# Patient Record
Sex: Female | Born: 1970 | Race: White | Hispanic: No | Marital: Single | Smoking: Never smoker
Health system: Southern US, Community
[De-identification: ages and names within clinical notes are randomized; demographics above are authoritative.]

## PROBLEM LIST (undated history)

## (undated) DIAGNOSIS — M859 Disorder of bone density and structure, unspecified: Secondary | ICD-10-CM

## (undated) DIAGNOSIS — F32A Depression, unspecified: Secondary | ICD-10-CM

## (undated) DIAGNOSIS — S72009A Fracture of unspecified part of neck of unspecified femur, initial encounter for closed fracture: Secondary | ICD-10-CM

## (undated) DIAGNOSIS — F71 Moderate intellectual disabilities: Secondary | ICD-10-CM

## (undated) DIAGNOSIS — R2681 Unsteadiness on feet: Secondary | ICD-10-CM

## (undated) DIAGNOSIS — F209 Schizophrenia, unspecified: Secondary | ICD-10-CM

## (undated) DIAGNOSIS — F79 Unspecified intellectual disabilities: Secondary | ICD-10-CM

## (undated) DIAGNOSIS — F329 Major depressive disorder, single episode, unspecified: Secondary | ICD-10-CM

## (undated) DIAGNOSIS — G800 Spastic quadriplegic cerebral palsy: Secondary | ICD-10-CM

## (undated) DIAGNOSIS — G809 Cerebral palsy, unspecified: Secondary | ICD-10-CM

## (undated) DIAGNOSIS — F200 Paranoid schizophrenia: Secondary | ICD-10-CM

## (undated) DIAGNOSIS — R569 Unspecified convulsions: Secondary | ICD-10-CM

## (undated) DIAGNOSIS — S72001A Fracture of unspecified part of neck of right femur, initial encounter for closed fracture: Secondary | ICD-10-CM

## (undated) DIAGNOSIS — M810 Age-related osteoporosis without current pathological fracture: Secondary | ICD-10-CM

## (undated) DIAGNOSIS — R5600 Simple febrile convulsions: Secondary | ICD-10-CM

## (undated) DIAGNOSIS — F419 Anxiety disorder, unspecified: Secondary | ICD-10-CM

## (undated) HISTORY — DX: Simple febrile convulsions: R56.00

## (undated) HISTORY — DX: Paranoid schizophrenia: F20.0

## (undated) HISTORY — DX: Moderate intellectual disabilities: F71

## (undated) HISTORY — DX: Fracture of unspecified part of neck of right femur, initial encounter for closed fracture: S72.001A

## (undated) HISTORY — DX: Disorder of bone density and structure, unspecified: M85.9

## (undated) HISTORY — DX: Unsteadiness on feet: R26.81

## (undated) HISTORY — DX: Spastic quadriplegic cerebral palsy: G80.0

## (undated) HISTORY — DX: Age-related osteoporosis without current pathological fracture: M81.0

---
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
## 2017-09-25 DIAGNOSIS — M9701XA Periprosthetic fracture around internal prosthetic right hip joint, initial encounter: Secondary | ICD-10-CM | POA: Insufficient documentation

## 2017-11-12 DIAGNOSIS — Z96649 Presence of unspecified artificial hip joint: Secondary | ICD-10-CM | POA: Insufficient documentation

## 2018-01-12 DIAGNOSIS — S72009A Fracture of unspecified part of neck of unspecified femur, initial encounter for closed fracture: Secondary | ICD-10-CM | POA: Insufficient documentation

## 2018-01-12 DIAGNOSIS — G809 Cerebral palsy, unspecified: Secondary | ICD-10-CM | POA: Insufficient documentation

## 2018-01-12 DIAGNOSIS — R569 Unspecified convulsions: Secondary | ICD-10-CM

## 2018-01-12 DIAGNOSIS — I10 Essential (primary) hypertension: Secondary | ICD-10-CM | POA: Insufficient documentation

## 2018-01-12 DIAGNOSIS — R6250 Unspecified lack of expected normal physiological development in childhood: Secondary | ICD-10-CM | POA: Insufficient documentation

## 2018-01-12 DIAGNOSIS — F209 Schizophrenia, unspecified: Secondary | ICD-10-CM | POA: Insufficient documentation

## 2018-01-12 DIAGNOSIS — F419 Anxiety disorder, unspecified: Secondary | ICD-10-CM | POA: Insufficient documentation

## 2018-01-12 DIAGNOSIS — K219 Gastro-esophageal reflux disease without esophagitis: Secondary | ICD-10-CM | POA: Insufficient documentation

## 2018-01-12 DIAGNOSIS — Z01419 Encounter for gynecological examination (general) (routine) without abnormal findings: Secondary | ICD-10-CM | POA: Insufficient documentation

## 2018-01-12 DIAGNOSIS — G40909 Epilepsy, unspecified, not intractable, without status epilepticus: Secondary | ICD-10-CM | POA: Insufficient documentation

## 2018-01-12 DIAGNOSIS — M8589 Other specified disorders of bone density and structure, multiple sites: Secondary | ICD-10-CM | POA: Insufficient documentation

## 2018-05-23 DIAGNOSIS — M79604 Pain in right leg: Secondary | ICD-10-CM | POA: Insufficient documentation

## 2018-05-24 DIAGNOSIS — R7989 Other specified abnormal findings of blood chemistry: Secondary | ICD-10-CM | POA: Insufficient documentation

## 2018-07-19 ENCOUNTER — Other Ambulatory Visit: Payer: Self-pay | Admitting: Family Medicine

## 2018-07-19 DIAGNOSIS — Z1231 Encounter for screening mammogram for malignant neoplasm of breast: Secondary | ICD-10-CM

## 2018-07-19 DIAGNOSIS — E2839 Other primary ovarian failure: Secondary | ICD-10-CM

## 2018-07-21 ENCOUNTER — Other Ambulatory Visit: Payer: Self-pay | Admitting: Family Medicine

## 2018-07-21 DIAGNOSIS — Z13820 Encounter for screening for osteoporosis: Secondary | ICD-10-CM

## 2018-07-21 DIAGNOSIS — Z87311 Personal history of (healed) other pathological fracture: Secondary | ICD-10-CM

## 2018-08-09 ENCOUNTER — Encounter: Payer: Self-pay | Admitting: Sports Medicine

## 2018-08-09 ENCOUNTER — Ambulatory Visit (INDEPENDENT_AMBULATORY_CARE_PROVIDER_SITE_OTHER): Payer: Medicare Other | Admitting: Sports Medicine

## 2018-08-09 ENCOUNTER — Other Ambulatory Visit: Payer: Self-pay

## 2018-08-09 VITALS — BP 98/66 | HR 77 | Resp 16

## 2018-08-09 DIAGNOSIS — B351 Tinea unguium: Secondary | ICD-10-CM

## 2018-08-09 DIAGNOSIS — M79675 Pain in left toe(s): Secondary | ICD-10-CM

## 2018-08-09 DIAGNOSIS — R6250 Unspecified lack of expected normal physiological development in childhood: Secondary | ICD-10-CM

## 2018-08-09 DIAGNOSIS — M79674 Pain in right toe(s): Secondary | ICD-10-CM | POA: Diagnosis not present

## 2018-08-09 DIAGNOSIS — G808 Other cerebral palsy: Secondary | ICD-10-CM

## 2018-08-09 NOTE — Progress Notes (Signed)
  Subjective: Katherine Wolf is a 47 y.o. female patient seen today in office with complaint of mildly painful thickened and elongated toenails; unable to trim. Patient is assisted by facility caregiver who reports that patient is here for a nail trim and states that her guardian is concerned about the right great toenail which appears to be taking down in the skin.  Patient and facility caregiver denies history of Diabetes, Neuropathy, or Vascular disease. Patient has no other pedal complaints at this time.   Review of Systems  All other systems reviewed and are negative.    Patient Active Problem List   Diagnosis Date Noted  . Elevated d-dimer 05/24/2018  . Pain of right lower extremity 05/23/2018  . Anxiety 01/12/2018  . Cerebral palsy (HCC) 01/12/2018  . 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 GERD (gastroesophageal reflux disease) 01/12/2018  . Hip fracture (HCC) 01/12/2018  . Hypertension 01/12/2018  . Osteopenia of multiple sites 01/12/2018  . Schizophrenia (HCC) 01/12/2018  . Seizures (HCC) 01/12/2018  . Well woman exam 01/12/2018  . 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 Periprosthetic fracture around internal prosthetic right hip joint (HCC) 09/25/2017    Current Outpatient Medications on File Prior to Visit  Medication Sig Dispense Refill  . Cholecalciferol (VITAMIN D3) 1000 units CAPS Take 1 capsule by mouth daily.    Marland Kitchen docusate sodium (COLACE) 50 MG capsule Take 50 mg by mouth 2 (two) times daily.     No current facility-administered medications on file prior to visit.     Allergies  Allergen Reactions  . Penicillins     Objective: Physical Exam  General: Well developed, nourished, no acute distress, awake, alert and oriented x 2  Vascular: Dorsalis pedis artery 1/4 bilateral, Posterior tibial artery 1/4 bilateral, skin temperature warm to warm proximal to distal bilateral lower extremities, no varicosities, pedal hair present  bilateral.  Neurological: Gross sensation present via light touch bilateral.   Dermatological: Skin is warm, dry, and supple bilateral, Nails 1-10 are tender, long, thick, and discolored with mild subungal debris, no webspace macerations present bilateral with distal lifting of the right hallux nail and severe incurvation with subungual debris, no open lesions present bilateral, no callus/corns/hyperkeratotic tissue present bilateral. No signs of infection bilateral.  Musculoskeletal: No symptomatic boney deformities noted bilateral. Muscular strength diminished due to cerebral palsy.  No pain with calf compression bilateral.  Assessment and Plan:  Problem List Items Addressed This Visit      Nervous and Auditory   Cerebral palsy (HCC)     Other   Delay in development    Other Visit Diagnoses    Pain due to onychomycosis of toenails of both feet    -  Primary      -Examined patient.  -Discussed treatment options for painful mycotic nails. -Mechanically debrided and reduced mycotic nails with sterile nail nipper and dremel nail file without incident. -Patient to return in 3 months for follow up evaluation with myself or Dr. Eloy End or sooner if symptoms worsen.  Asencion Islam, DPM

## 2018-08-10 ENCOUNTER — Other Ambulatory Visit (HOSPITAL_BASED_OUTPATIENT_CLINIC_OR_DEPARTMENT_OTHER): Payer: Self-pay

## 2018-08-10 DIAGNOSIS — R0683 Snoring: Secondary | ICD-10-CM

## 2018-08-10 DIAGNOSIS — G4730 Sleep apnea, unspecified: Secondary | ICD-10-CM

## 2018-08-15 DIAGNOSIS — Z01419 Encounter for gynecological examination (general) (routine) without abnormal findings: Secondary | ICD-10-CM | POA: Insufficient documentation

## 2018-08-25 ENCOUNTER — Ambulatory Visit: Payer: Self-pay

## 2018-09-14 ENCOUNTER — Ambulatory Visit (HOSPITAL_BASED_OUTPATIENT_CLINIC_OR_DEPARTMENT_OTHER): Payer: Medicare Other | Attending: Internal Medicine | Admitting: Internal Medicine

## 2018-09-14 VITALS — Ht 66.0 in | Wt 162.4 lb

## 2018-09-14 DIAGNOSIS — I10 Essential (primary) hypertension: Secondary | ICD-10-CM | POA: Insufficient documentation

## 2018-09-14 DIAGNOSIS — G4730 Sleep apnea, unspecified: Secondary | ICD-10-CM

## 2018-09-14 DIAGNOSIS — R0689 Other abnormalities of breathing: Secondary | ICD-10-CM

## 2018-09-14 DIAGNOSIS — F329 Major depressive disorder, single episode, unspecified: Secondary | ICD-10-CM | POA: Insufficient documentation

## 2018-09-14 DIAGNOSIS — R0683 Snoring: Secondary | ICD-10-CM | POA: Diagnosis present

## 2018-09-14 DIAGNOSIS — R5383 Other fatigue: Secondary | ICD-10-CM | POA: Insufficient documentation

## 2018-09-14 DIAGNOSIS — R0681 Apnea, not elsewhere classified: Secondary | ICD-10-CM

## 2018-09-19 ENCOUNTER — Ambulatory Visit: Payer: Medicare Other | Admitting: Diagnostic Neuroimaging

## 2018-09-21 ENCOUNTER — Ambulatory Visit
Admission: RE | Admit: 2018-09-21 | Discharge: 2018-09-21 | Disposition: A | Discharge status: Home / Self Care | Payer: Medicare Other | Source: Ambulatory Visit | Attending: Family Medicine | Admitting: Family Medicine

## 2018-09-21 ENCOUNTER — Ambulatory Visit: Payer: Medicare Other | Admitting: Diagnostic Neuroimaging

## 2018-09-21 DIAGNOSIS — Z87311 Personal history of (healed) other pathological fracture: Secondary | ICD-10-CM

## 2018-09-21 DIAGNOSIS — Z13820 Encounter for screening for osteoporosis: Secondary | ICD-10-CM

## 2018-09-23 ENCOUNTER — Ambulatory Visit: Payer: Medicare Other | Admitting: Diagnostic Neuroimaging

## 2018-09-25 DIAGNOSIS — R0683 Snoring: Secondary | ICD-10-CM | POA: Diagnosis not present

## 2018-09-25 NOTE — Procedures (Signed)
    Patient Name: Katherine Wolf, [name] Date: 09/14/2018  Gender: Female D.O.B: October 23, 1970 Age (years): [age] Referring Provider: Briscoe DeutscherSamantha Criste NP Height (inches): 66 Interpreting Physician: Jetty Duhamellinton Shahed Yeoman MD, ABSM Weight (lbs): 162.40 RPSGT: Shelah LewandowskyGregory, Kenyon BMI: 26 MRN: [id number] Neck Size: 14.00  CLINICAL INFORMATION Sleep Study Type: NPSG Indication for sleep study: Daytime Fatigue, Depression, Hypertension, Nocturnal Gasping, Snoring, Witnesses Apnea / Gasping During Sleep Epworth Sleepiness Score: 3  SLEEP STUDY TECHNIQUE As per the AASM Manual for the Scoring of Sleep and Associated Events v2.3 (April 2016) with a hypopnea requiring 4% desaturations.  The channels recorded and monitored were frontal, central and occipital EEG, electrooculogram (EOG), submentalis EMG (chin), nasal and oral airflow, thoracic and abdominal wall motion, anterior tibialis EMG, snore microphone, electrocardiogram, and pulse oximetry.  MEDICATIONS Medications self-administered by patient taken the night of the study : none reported  SLEEP ARCHITECTURE The study was initiated at 9:28:51 PM and ended at 4:57:28 AM.  Sleep onset time was 5.1 minutes and the sleep efficiency was 87.5%%. The total sleep time was 392.5 minutes.  Stage REM latency was 159.0 minutes.  The patient spent 1.1%% of the night in stage N1 sleep, 80.0%% in stage N2 sleep, 0.1%% in stage N3 and 18.7% in REM.  Alpha intrusion was absent.  Supine sleep was 60.89%.  RESPIRATORY PARAMETERS The overall apnea/hypopnea index (AHI) was 0.2 per hour. There were 1 total apneas, including 0 obstructive, 1 central and 0 mixed apneas. There were 0 hypopneas and 8 RERAs.  The AHI during Stage REM sleep was 0.8 per hour.  AHI while supine was 0.0 per hour.  The mean oxygen saturation was 93.9%. The minimum SpO2 during sleep was 90.0%.  soft snoring was noted during this study.  CARDIAC DATA The 2 lead EKG demonstrated sinus rhythm.  The mean heart rate was 70.7 beats per minute. Other EKG findings include: None.  LEG MOVEMENT DATA The total PLMS were 0 with a resulting PLMS index of 0.0. Associated arousal with leg movement index was 0.2 .  IMPRESSIONS - No significant obstructive sleep apnea occurred during this study (AHI = 0.2/h). - No significant central sleep apnea occurred during this study (CAI = 0.2/h). - The patient had minimal or no oxygen desaturation during the study (Min O2 = 90.0%) - The patient snored with soft snoring volume. - No cardiac abnormalities were noted during this study. - Clinically significant periodic limb movements did not occur during sleep. No significant associated arousals.  DIAGNOSIS - Primary snoring  RECOMMENDATIONS - Be caareful with alcohol, sedatives and other CNS depressants that may worsen sleep apnea and disrupt normal sleep architecture. - Sleep hygiene should be reviewed to assess factors that may improve sleep quality. - Weight management and regular exercise should be initiated or continued if appropriate.  [Electronically signed] 09/25/2018 11:27 AM  Jetty Duhamellinton Fronnie Urton MD, ABSM Diplomate, American Board of Sleep Medicine   NPI: [id number]                         Jetty Duhamellinton Demetra Moya Diplomate, American Board of Sleep Medicine  ELECTRONICALLY SIGNED ON:  09/25/2018, 11:26 AM [facility] PH: [phone]   FX: [phone] ACCREDITED BY THE AMERICAN ACADEMY OF SLEEP MEDICINE

## 2018-10-31 ENCOUNTER — Encounter: Payer: Self-pay | Admitting: Diagnostic Neuroimaging

## 2018-10-31 ENCOUNTER — Ambulatory Visit (INDEPENDENT_AMBULATORY_CARE_PROVIDER_SITE_OTHER): Payer: Medicare Other | Admitting: Diagnostic Neuroimaging

## 2018-10-31 VITALS — BP 98/62 | HR 76 | Ht 66.0 in | Wt 164.6 lb

## 2018-10-31 DIAGNOSIS — R6250 Unspecified lack of expected normal physiological development in childhood: Secondary | ICD-10-CM

## 2018-10-31 DIAGNOSIS — G40909 Epilepsy, unspecified, not intractable, without status epilepticus: Secondary | ICD-10-CM | POA: Diagnosis not present

## 2018-10-31 NOTE — Patient Instructions (Signed)
SEIZURE DISORDER - continue vimpat 200mg  twice a day - continue divalproex 250mg  twice a day

## 2018-10-31 NOTE — Progress Notes (Signed)
[facility]  PATIENT: Katherine Wolf DOB: 06-22-1971  REFERRING CLINICIAN: Royal HISTORY FROM: patient, caregiver, chart review REASON FOR VISIT: new consult    HISTORICAL  CHIEF COMPLAINT:  Chief Complaint  Patient presents with  . New Patient (Initial Visit)    Referred by Katina Dung, MD  . Seizures    Rm 6, Caregiver with [facility], Louise.  Lived with [facility] since 101/2019, Has had one small sz since been with them.      HISTORY OF PRESENT ILLNESS:   48 year old female with developmental delay, here for evaluation of seizure disorder.  Patient arrives with transporter.  Patient and transporter do not have any additional clinical information.  I reviewed referring records in chart.  Last seizure June 2018.  Patient is stable on Vimpat and Depakote.  No triggering or aggravating factors.    REVIEW OF SYSTEMS: Full 14 system review of systems performed and negative with exception of: As per HPI.  ALLERGIES: Allergies  Allergen Reactions  . Penicillins     HOME MEDICATIONS: Outpatient Medications Prior to Visit  Medication Sig Dispense Refill  . acetaminophen (TYLENOL) 325 MG tablet Take 650 mg by mouth at bedtime.     Marland Kitchen alum & mag hydroxide-simeth (MAALOX/MYLANTA) 200-200-20 MG/5ML suspension Take 15 mLs by mouth every 6 (six) hours as needed for indigestion or heartburn.    . Calcium Carbonate-Vitamin D 600-400 MG-UNIT tablet Take by mouth.    . cholecalciferol (VITAMIN D3) 25 MCG (1000 UT) tablet Take 1,000 Units by mouth daily.    . citalopram (CELEXA) 20 MG tablet TAKE 1.5 TABLETS EVERY DAY  2  . diazepam (DIASTAT ACUDIAL) 10 MG GEL Place rectally as needed for seizure.    . diphenhydrAMINE (BENADRYL) 25 mg capsule Take 25 mg by mouth every 4 (four) hours as needed.    . divalproex (DEPAKOTE) 250 MG DR tablet Take 250 mg by mouth 2 (two) times daily.  11  . docusate sodium (COLACE) 50 MG capsule Take 50 mg by mouth 2 (two) times daily.    Marland Kitchen guaifenesin  (ROBITUSSIN) 100 MG/5ML syrup Take 15 mLs by mouth 3 (three) times daily as needed for cough.    . hydrOXYzine (ATARAX/VISTARIL) 25 MG tablet Take 50 mg by mouth 3 (three) times daily.  0  . ibuprofen (ADVIL,MOTRIN) 200 MG tablet Take 200 mg by mouth every 6 (six) hours as needed.    . loperamide (IMODIUM A-D) 2 MG tablet Take 2 mg by mouth as needed for diarrhea or loose stools.    . magnesium hydroxide (MILK OF MAGNESIA) 400 MG/5ML suspension Take 30 mLs by mouth daily as needed for mild constipation.    . metoprolol tartrate (LOPRESSOR) 25 MG tablet Take 25 mg by mouth 2 (two) times daily.  2  . OLANZapine (ZYPREXA) 2.5 MG tablet Take 2.5 mg by mouth at bedtime.  0  . Olopatadine HCl 0.2 % SOLN every morning.     . pantoprazole (PROTONIX) 20 MG tablet Take 20 mg by mouth daily.    . promethazine (PHENERGAN) 25 MG tablet Take 25 mg by mouth every 6 (six) hours as needed for nausea or vomiting.    Marland Kitchen VIMPAT 200 MG TABS tablet TAKE 1 TABLET (200 MG TOTAL) BY MOUTH TWO TIMES A DAY .  5  . Cholecalciferol (VITAMIN D-1000 MAX ST) 1000 units tablet Take by mouth.    . Cholecalciferol (VITAMIN D3) 1000 units CAPS Take 1 capsule by mouth daily.    Marland Kitchen  clindamycin (CLEOCIN) 150 MG capsule Take 150 mg by mouth 3 (three) times daily.  0  . divalproex (DEPAKOTE) 500 MG DR tablet TAKE 1 TABLET (500 MG TOTAL) BY MOUTH TWO TIMES A DAY.  11  . esomeprazole (NEXIUM) 20 MG capsule TAKE 1 CAPSULE BY MOUTH EVERY DAY IN THE MORNING BEFORE BREAKFAST  11  . famotidine (PEPCID) 20 MG tablet Take 20 mg by mouth 2 (two) times daily.  2  . pantoprazole (PROTONIX) 40 MG tablet Take 40 mg by mouth daily.  11  . polyethylene glycol (MIRALAX / GLYCOLAX) packet Take by mouth.    . senna-docusate (SENOKOT-S) 8.6-50 MG tablet Take by mouth.     No facility-administered medications prior to visit.     PAST MEDICAL HISTORY: No past medical history on file.  PAST SURGICAL HISTORY: No past surgical history on file.  FAMILY  HISTORY: No family history on file.  SOCIAL HISTORY: Social History   Socioeconomic History  . Marital status: Single    Spouse name: Not on file  . Number of children: Not on file  . Years of education: Not on file  . Highest education level: Not on file  Occupational History  . Not on file  Social Needs  . Financial resource strain: Not on file  . Food insecurity:    Worry: Not on file    Inability: Not on file  . Transportation needs:    Medical: Not on file    Non-medical: Not on file  Tobacco Use  . Smoking status: Never Smoker  . Smokeless tobacco: Never Used  Substance and Sexual Activity  . Alcohol use: Not on file  . Drug use: Not on file  . Sexual activity: Not on file  Lifestyle  . Physical activity:    Days per week: Not on file    Minutes per session: Not on file  . Stress: Not on file  Relationships  . Social connections:    Talks on phone: Not on file    Gets together: Not on file    Attends religious service: Not on file    Active member of club or organization: Not on file    Attends meetings of clubs or organizations: Not on file    Relationship status: Not on file  . Intimate partner violence:    Fear of current or ex partner: Not on file    Emotionally abused: Not on file    Physically abused: Not on file    Forced sexual activity: Not on file  Other Topics Concern  . Not on file  Social History Narrative  . Not on file     PHYSICAL EXAM  GENERAL EXAM/CONSTITUTIONAL: Vitals:  Vitals:   10/31/18 1009  BP: 98/62  Pulse: 76  Weight: 164 lb 9.6 oz (74.7 kg)  Height: 5\' 6"  (1.676 m)     Body mass index is 26.57 kg/m. Wt Readings from Last 3 Encounters:  10/31/18 164 lb 9.6 oz (74.7 kg)  09/14/18 162 lb 6.4 oz (73.7 kg)     Patient is in no distress; well developed, nourished and groomed; neck is supple  CARDIOVASCULAR:  Examination of carotid arteries is normal; no carotid bruits  Regular rate and rhythm, no  murmurs  Examination of peripheral vascular system by observation and palpation is normal  EYES:  Ophthalmoscopic exam of optic discs and posterior segments is normal; no papilledema or hemorrhages  No exam data present  MUSCULOSKELETAL:  Gait, strength, tone, movements noted  in Neurologic exam below  NEUROLOGIC: MENTAL STATUS:  No flowsheet data found.  awake, alert, oriented to person  [facility] LP[facility]CR MEMORY   DECR attention and concentration  DECR FLUENCY; FOLLOWS SIMPLE COMMANDS  DECR fund of knowledge  PLEASANT, SMILING  CRANIAL NERVE:   2nd - no papilledema on fundoscopic exam  2nd, 3rd, 4th, 6th - pupils equal and reactive to light, visual fields full to confrontation, extraocular muscles intact, no nystagmus  5th - facial sensation symmetric  7th - facial strength symmetric  8th - hearing intact  9th - palate elevates symmetrically, uvula midline  11th - shoulder shrug symmetric  12th - tongue protrusion midline  MOTOR:   MILD REST TREMOR OF RIGHT THUMB  normal bulk and tone, full strength in the BUE, BLE  SENSORY:   normal and symmetric to light touch  COORDINATION:   finger-nose-finger, fine finger movements SLOW  REFLEXES:   deep tendon reflexes present and symmetric  GAIT/STATION:   narrow based gait     DIAGNOSTIC DATA (LABS, IMAGING, TESTING) - I reviewed patient records, labs, notes, testing and imaging myself where available.  No results found for: WBC, HGB, HCT, MCV, PLT No results found for: NA, K, CL, CO2, GLUCOSE, BUN, CREATININE, CALCIUM, PROT, ALBUMIN, AST, ALT, ALKPHOS, BILITOT, GFRNONAA, GFRAA No results found for: CHOL, HDL, LDLCALC, LDLDIRECT, TRIG, CHOLHDL No results found for: [id number] No results found for: VITAMINB12 No results found for: TSH      ASSESSMENT AND PLAN  48 y.o. year old female here with seizure disorder and developmental delay.  Last seizure 04/07/17  Dx:  1. Seizure disorder (HCC)   2.  Developmental delay     PLAN:  SEIZURE DISORDER - continue vimpat 200mg  twice a day - continue divalproex 250mg  twice a day   Return in about 1 year (around 11/01/2019).    Suanne MarkerVIKRAM R. Joss Friedel, MD 10/31/2018, 11:06 AM Certified in Neurology, Neurophysiology and Neuroimaging  [facility] [address], [address] [place], [place] [address] [phone]

## 2018-11-02 ENCOUNTER — Ambulatory Visit (INDEPENDENT_AMBULATORY_CARE_PROVIDER_SITE_OTHER): Payer: Medicare Other | Admitting: Podiatry

## 2018-11-02 DIAGNOSIS — B351 Tinea unguium: Secondary | ICD-10-CM | POA: Diagnosis not present

## 2018-11-02 DIAGNOSIS — M79674 Pain in right toe(s): Secondary | ICD-10-CM | POA: Diagnosis not present

## 2018-11-02 DIAGNOSIS — M79675 Pain in left toe(s): Secondary | ICD-10-CM | POA: Diagnosis not present

## 2018-11-02 DIAGNOSIS — L600 Ingrowing nail: Secondary | ICD-10-CM

## 2018-11-02 NOTE — Patient Instructions (Signed)

## 2018-11-08 ENCOUNTER — Ambulatory Visit: Payer: Medicare Other | Admitting: Podiatry

## 2018-11-11 ENCOUNTER — Ambulatory Visit: Payer: Medicare Other | Admitting: Podiatry

## 2018-11-14 ENCOUNTER — Encounter: Payer: Self-pay | Admitting: Podiatry

## 2018-11-14 NOTE — Progress Notes (Signed)
Subjective: Katherine Wolf presents today with painful, thick toenails 1-5 b/l that she cannot cut and which interfere with daily activities.  Pain is aggravated when wearing enclosed shoe gear.  She is a resident [facility] and resides in a group home.  She voices no new complaints on today's visit.  Katherine Wolf is accompanied by caregiver on today.  Katherine Wolf, Katherine Wolf is her PCP.   Current Outpatient Medications:  .  acetaminophen (TYLENOL) 325 MG tablet, Take 650 mg by mouth at bedtime. , Disp: , Rfl:  .  alum & mag hydroxide-simeth (MAALOX/MYLANTA) 200-200-20 MG/5ML suspension, Take 15 mLs by mouth every 6 (six) hours as needed for indigestion or heartburn., Disp: , Rfl:  .  Calcium Carbonate-Vitamin D 600-400 MG-UNIT tablet, Take by mouth., Disp: , Rfl:  .  cholecalciferol (VITAMIN D3) 25 MCG (1000 UT) tablet, Take 1,000 Units by mouth daily., Disp: , Rfl:  .  citalopram (CELEXA) 20 MG tablet, TAKE 1.5 TABLETS EVERY DAY, Disp: , Rfl: 2 .  clindamycin (CLEOCIN) 150 MG capsule, Take by mouth., Disp: , Rfl:  .  diazepam (DIASTAT ACUDIAL) 10 MG GEL, Place rectally as needed for seizure., Disp: , Rfl:  .  diphenhydrAMINE (BENADRYL) 25 mg capsule, Take 25 mg by mouth every 4 (four) hours as needed., Disp: , Rfl:  .  divalproex (DEPAKOTE) 250 MG DR tablet, Take 250 mg by mouth 2 (two) times daily., Disp: , Rfl: 11 .  docusate sodium (COLACE) 50 MG capsule, Take 50 mg by mouth 2 (two) times daily., Disp: , Rfl:  .  esomeprazole (NEXIUM) 20 MG capsule, TAKE 1 CAPSULE BY MOUTH EVERY DAY IN THE MORNING BEFORE BREAKFAST, Disp: , Rfl:  .  famotidine (PEPCID) 20 MG tablet, Take by mouth., Disp: , Rfl:  .  guaifenesin (ROBITUSSIN) 100 MG/5ML syrup, Take 15 mLs by mouth 3 (three) times daily as needed for cough., Disp: , Rfl:  .  hydrOXYzine (ATARAX/VISTARIL) 25 MG tablet, Take 50 mg by mouth 3 (three) times daily., Disp: , Rfl: 0 .  hydrOXYzine (VISTARIL) 50 MG capsule, Take by mouth., Disp: , Rfl:  .   ibuprofen (ADVIL,MOTRIN) 200 MG tablet, Take 200 mg by mouth every 6 (six) hours as needed., Disp: , Rfl:  .  loperamide (IMODIUM A-D) 2 MG tablet, Take 2 mg by mouth as needed for diarrhea or loose stools., Disp: , Rfl:  .  magnesium hydroxide (MILK OF MAGNESIA) 400 MG/5ML suspension, Take 30 mLs by mouth daily as needed for mild constipation., Disp: , Rfl:  .  metoprolol tartrate (LOPRESSOR) 25 MG tablet, Take 25 mg by mouth 2 (two) times daily., Disp: , Rfl: 2 .  OLANZapine (ZYPREXA) 2.5 MG tablet, Take 2.5 mg by mouth at bedtime., Disp: , Rfl: 0 .  Olopatadine HCl 0.2 % SOLN, every morning. , Disp: , Rfl:  .  pantoprazole (PROTONIX) 20 MG tablet, Take 20 mg by mouth daily., Disp: , Rfl:  .  promethazine (PHENERGAN) 25 MG tablet, Take 25 mg by mouth every 6 (six) hours as needed for nausea or vomiting., Disp: , Rfl:  .  senna-docusate (SENOKOT-S) 8.6-50 MG tablet, Take by mouth., Disp: , Rfl:  .  VIMPAT 200 MG TABS tablet, TAKE 1 TABLET (200 MG TOTAL) BY MOUTH TWO TIMES A DAY ., Disp: , Rfl: 5  Allergies  Allergen Reactions  . Penicillins     Objective:  Vascular Examination: Capillary refill time immediate x 10 digits  Dorsalis pedis 1/4 bilaterally  Posterior  tibial pulses 1/4 bilaterally b/l  Digital hair present x 10 digits  Skin temperature gradient WNL b/l  Dermatological Examination: Skin with normal turgor, texture and tone b/l  Toenails 1-5 b/l discolored, thick, dystrophic with subungual debris and pain with palpation to nailbeds due to thickness of nails.  Right great toe noted to have an incurvated nail plate medial border.  There is no erythema, no edema, no drainage noted bilaterally.  There is tenderness to palpation.  No interdigital macerations noted bilaterally  No open wounds noted bilaterally  Musculoskeletal: Lower extremity muscle atrophy noted bilaterally.  No gross bony deformities b/l.  No pain, crepitus or joint limitation noted with ROM.    No pain with calf compression noted bilaterally  Neurological: Sensation intact with 10 gram monofilament. Vibratory sensation intact.  Assessment: Painful onychomycosis toenails 1-5 b/l  Ingrown toenail right great toe medial border, noninfected  Plan: 1. Toenails 1-5 b/l were debrided in length and girth without iatrogenic bleeding.  Offending nail border right great toe debrided and curettaged without incident.  Border cleansed with alcohol.  Antibiotic ointment applied.  Orders written for facility to apply triple antibiotic ointment to the right great toe once daily for 10 days. 2. Patient to continue soft, supportive shoe gear 3. Patient to report any pedal injuries to medical professional immediately. 4. Follow up 3 months. Patient/POA to call should there be a concern in the interim.

## 2018-12-07 ENCOUNTER — Other Ambulatory Visit: Payer: Self-pay

## 2018-12-07 ENCOUNTER — Emergency Department (HOSPITAL_COMMUNITY)
Admission: EM | Admit: 2018-12-07 | Discharge: 2018-12-08 | Disposition: A | Discharge status: Home / Self Care | Payer: Medicare Other | Attending: Emergency Medicine | Admitting: Emergency Medicine

## 2018-12-07 ENCOUNTER — Encounter (HOSPITAL_COMMUNITY): Payer: Self-pay | Admitting: *Deleted

## 2018-12-07 ENCOUNTER — Emergency Department (HOSPITAL_COMMUNITY): Payer: Medicare Other

## 2018-12-07 DIAGNOSIS — G809 Cerebral palsy, unspecified: Secondary | ICD-10-CM | POA: Diagnosis not present

## 2018-12-07 DIAGNOSIS — M25552 Pain in left hip: Secondary | ICD-10-CM | POA: Insufficient documentation

## 2018-12-07 DIAGNOSIS — Z79899 Other long term (current) drug therapy: Secondary | ICD-10-CM | POA: Diagnosis not present

## 2018-12-07 DIAGNOSIS — W010XXA Fall on same level from slipping, tripping and stumbling without subsequent striking against object, initial encounter: Secondary | ICD-10-CM | POA: Diagnosis not present

## 2018-12-07 DIAGNOSIS — M25551 Pain in right hip: Secondary | ICD-10-CM | POA: Insufficient documentation

## 2018-12-07 DIAGNOSIS — R569 Unspecified convulsions: Secondary | ICD-10-CM | POA: Diagnosis not present

## 2018-12-07 DIAGNOSIS — W19XXXA Unspecified fall, initial encounter: Secondary | ICD-10-CM

## 2018-12-07 HISTORY — DX: Cerebral palsy, unspecified: G80.9

## 2018-12-07 HISTORY — DX: Fracture of unspecified part of neck of unspecified femur, initial encounter for closed fracture: S72.009A

## 2018-12-07 HISTORY — DX: Age-related osteoporosis without current pathological fracture: M81.0

## 2018-12-07 HISTORY — DX: Unspecified convulsions: R56.9

## 2018-12-07 LAB — CBC WITH DIFFERENTIAL/PLATELET
Abs Immature Granulocytes: 0.02 10*3/uL (ref 0.00–0.07)
Basophils Absolute: 0.1 10*3/uL (ref 0.0–0.1)
Basophils Relative: 1 %
Eosinophils Absolute: 0.4 10*3/uL (ref 0.0–0.5)
Eosinophils Relative: 5 %
HCT: 38.4 % (ref 36.0–46.0)
Hemoglobin: 12.6 g/dL (ref 12.0–15.0)
Immature Granulocytes: 0 %
Lymphocytes Relative: 36 %
Lymphs Abs: 2.7 10*3/uL (ref 0.7–4.0)
MCH: 30.8 pg (ref 26.0–34.0)
MCHC: 32.8 g/dL (ref 30.0–36.0)
MCV: 93.9 fL (ref 80.0–100.0)
Monocytes Absolute: 0.7 10*3/uL (ref 0.1–1.0)
Monocytes Relative: 9 %
Neutro Abs: 3.7 10*3/uL (ref 1.7–7.7)
Neutrophils Relative %: 49 %
Platelets: 206 10*3/uL (ref 150–400)
RBC: 4.09 MIL/uL (ref 3.87–5.11)
RDW: 13.6 % (ref 11.5–15.5)
WBC: 7.5 10*3/uL (ref 4.0–10.5)
nRBC: 0 % (ref 0.0–0.2)

## 2018-12-07 LAB — COMPREHENSIVE METABOLIC PANEL
ALT: 10 U/L (ref 0–44)
AST: 20 U/L (ref 15–41)
Albumin: 4.4 g/dL (ref 3.5–5.0)
Alkaline Phosphatase: 73 U/L (ref 38–126)
Anion gap: 11 (ref 5–15)
BUN: 16 mg/dL (ref 6–20)
CO2: 24 mmol/L (ref 22–32)
Calcium: 9.4 mg/dL (ref 8.9–10.3)
Chloride: 103 mmol/L (ref 98–111)
Creatinine, Ser: 0.47 mg/dL (ref 0.44–1.00)
GFR calc Af Amer: >60 mL/min (ref >60)
GFR calc non Af Amer: >60 mL/min (ref >60)
Glucose, Bld: 86 mg/dL (ref 70–99)
Potassium: 3.3 mmol/L — ABNORMAL LOW (ref 3.5–5.1)
Sodium: 138 mmol/L (ref 135–145)
Total Bilirubin: 0.5 mg/dL (ref 0.3–1.2)
Total Protein: 8.2 g/dL — ABNORMAL HIGH (ref 6.5–8.1)

## 2018-12-07 LAB — I-STAT BETA HCG BLOOD, ED (NOT ORDERABLE): I-stat hCG, quantitative: <5 m[IU]/mL (ref <5)

## 2018-12-07 LAB — VALPROIC ACID LEVEL: Valproic Acid Lvl: 30 ug/mL — ABNORMAL LOW (ref 50.0–100.0)

## 2018-12-07 MED ORDER — VALPROATE SODIUM 500 MG/5ML IV SOLN
500.0000 mg | Freq: Once | INTRAVENOUS | Status: AC
  Administered 2018-12-07: 500 mg via INTRAVENOUS
  Filled 2018-12-07: qty 5

## 2018-12-07 NOTE — ED Triage Notes (Addendum)
Staff member states the nurses at her facility were concerned about her right and left hips after falling d/t seizure today.

## 2018-12-07 NOTE — ED Notes (Signed)
Caregiver in room.

## 2018-12-07 NOTE — ED Triage Notes (Signed)
Patient is from [facility] and transported via [facility]. According to EMS, patient had a fall. Patient is complaining of right leg pain but unable to identify the exact place of where she is in pain. Also, reports hitting her head but no LOC or obvious injury. Patient has a staff member from the group home accompanied with her.

## 2018-12-07 NOTE — ED Provider Notes (Signed)
[facility]-EMERGENCY DEPT Provider Note: Lowella Dell, MD, FACEP  CSN: [id number] MRN: [id number] ARRIVAL: 12/07/18 at 1755 ROOM: WA11/WA11   CHIEF COMPLAINT  Fall  Level 5 caveat: Mentally retarded HISTORY OF PRESENT ILLNESS  12/07/18 10:55 PM Katherine Wolf is a 48 y.o. female with cerebral palsy and seizure disorder.  She is here with bilateral hip pain after falling due to a seizure earlier today 5:30 PM.  She is complaining of pain to her right anterior scalp as well as pain in her right thigh although she is very vague about the location or severity.   Past Medical History:  Diagnosis Date  . Cerebral palsy (HCC)   . Hip fracture (HCC)   . Osteoporosis   . Seizures (HCC)     History reviewed. No pertinent surgical history.  No family history on file.  Social History   Tobacco Use  . Smoking status: Never Smoker  . Smokeless tobacco: Never Used  Substance Use Topics  . Alcohol use: Not on file  . Drug use: Not on file    Prior to Admission medications   Medication Sig Start Date End Date Taking? Authorizing Provider  acetaminophen (TYLENOL) 325 MG tablet Take 650 mg by mouth at bedtime.    Yes [provider]  alum & mag hydroxide-simeth (MAALOX/MYLANTA) 200-200-20 MG/5ML suspension Take 15 mLs by mouth every 6 (six) hours as needed for indigestion or heartburn.    [provider]  Calcium Carbonate-Vitamin D 600-400 MG-UNIT tablet Take by mouth.    [provider]  cholecalciferol (VITAMIN D3) 25 MCG (1000 UT) tablet Take 1,000 Units by mouth daily.    [provider]  citalopram (CELEXA) 20 MG tablet Take 30 mg by mouth daily.  06/24/18   [provider]  clindamycin (CLEOCIN) 150 MG capsule Take by mouth. 06/22/18   [provider]  diazepam (DIASTAT ACUDIAL) 10 MG GEL Place rectally as needed for seizure.    [provider]  diphenhydrAMINE (BENADRYL) 25 mg capsule Take 25 mg by mouth every 4 (four) hours as  needed.    [provider]  divalproex (DEPAKOTE) 250 MG DR tablet Take 250 mg by mouth 2 (two) times daily. 06/25/18   [provider]  docusate sodium (COLACE) 50 MG capsule Take 50 mg by mouth 2 (two) times daily.    [provider]  doxycycline (VIBRA-TABS) 100 MG tablet Take 100 mg by mouth 2 (two) times daily. 11/01/18   [provider]  esomeprazole (NEXIUM) 20 MG capsule Take 20 mg by mouth daily.  06/05/18   [provider]  famotidine (PEPCID) 20 MG tablet Take by mouth. 06/05/18   [provider]  guaifenesin (ROBITUSSIN) 100 MG/5ML syrup Take 15 mLs by mouth 3 (three) times daily as needed for cough.    [provider]  hydrOXYzine (ATARAX/VISTARIL) 25 MG tablet Take 50 mg by mouth 3 (three) times daily. 07/01/18   [provider]  hydrOXYzine (VISTARIL) 50 MG capsule Take by mouth.    [provider]  ibuprofen (ADVIL,MOTRIN) 200 MG tablet Take 200 mg by mouth every 6 (six) hours as needed.    [provider]  loperamide (IMODIUM A-D) 2 MG tablet Take 2 mg by mouth as needed for diarrhea or loose stools.    [provider]  magnesium hydroxide (MILK OF MAGNESIA) 400 MG/5ML suspension Take 30 mLs by mouth daily as needed for mild constipation.    [provider]  metoprolol  tartrate (LOPRESSOR) 25 MG tablet Take 25 mg by mouth 2 (two) times daily. 06/24/18   [provider]  OLANZapine (ZYPREXA) 2.5 MG tablet Take 2.5 mg by mouth at bedtime. 06/23/18   [provider]  Olopatadine HCl 0.2 % SOLN every morning.     [provider]  pantoprazole (PROTONIX) 20 MG tablet Take 20 mg by mouth daily.    [provider]  promethazine (PHENERGAN) 25 MG tablet Take 25 mg by mouth every 6 (six) hours as needed for nausea or vomiting.    [provider]  senna-docusate (SENOKOT-S) 8.6-50 MG tablet Take by mouth.    [provider]  VIMPAT 200 MG  TABS tablet Take 200 mg by mouth 2 (two) times daily.  07/10/18   [provider]    Allergies Penicillins   REVIEW OF SYSTEMS     PHYSICAL EXAMINATION  Initial Vital Signs Blood pressure 118/79, pulse 67, temperature 98.3 F (36.8 C), temperature source Oral, resp. rate 16, height 5\' 7"  (1.702 m), weight 74.8 kg, SpO2 100 %.  Examination General: Well-developed, well-nourished female in no acute distress; appearance consistent with age of record HENT: normocephalic; mild right anterior scalp tenderness Eyes: pupils equal, round and reactive to light; extraocular muscles grossly intact Neck: supple; nontender Heart: regular rate and rhythm Lungs: clear to auscultation bilaterally Abdomen: soft; nondistended; nontender; bowel sounds present Extremities: No deformity; full range of motion; pulses normal; equivocal tenderness of right thigh without swelling; no significant pain on passive range of motion of hips Neurologic: Awake, alert; motor function intact in all extremities and symmetric; no facial droop Skin: Warm and dry Psychiatric: Flat affect   RESULTS  Summary of this visit's results, reviewed by myself:   EKG Interpretation  Date/Time:    Ventricular Rate:    PR Interval:    QRS Duration:   QT Interval:    QTC Calculation:   R Axis:     Text Interpretation:        Laboratory Studies: Results for orders placed or performed during the hospital encounter of 12/07/18 (from the past 24 hour(s))  Comprehensive metabolic panel     Status: Abnormal   Collection Time: 12/07/18  9:25 PM  Result Value Ref Range   Sodium 138 135 - 145 mmol/L   Potassium 3.3 (L) 3.5 - 5.1 mmol/L   Chloride 103 98 - 111 mmol/L   CO2 24 22 - 32 mmol/L   Glucose, Bld 86 70 - 99 mg/dL   BUN 16 6 - 20 mg/dL   Creatinine, Ser 6.01 0.44 - 1.00 mg/dL   Calcium 9.4 8.9 - 56.1 mg/dL   Total Protein 8.2 (H) 6.5 - 8.1 g/dL   Albumin 4.4 3.5 - 5.0 g/dL   AST 20 15 - 41 U/L   ALT 10  0 - 44 U/L   Alkaline Phosphatase 73 38 - 126 U/L   Total Bilirubin 0.5 0.3 - 1.2 mg/dL   GFR calc non Af Amer >60 >60 mL/min   GFR calc Af Amer >60 >60 mL/min   Anion gap 11 5 - 15  CBC with Differential     Status: None   Collection Time: 12/07/18  9:25 PM  Result Value Ref Range   WBC 7.5 4.0 - 10.5 K/uL   RBC 4.09 3.87 - 5.11 MIL/uL   Hemoglobin 12.6 12.0 - 15.0 g/dL   HCT 53.7 94.3 - 27.6 %   MCV 93.9 80.0 - 100.0 fL   MCH  30.8 26.0 - 34.0 pg   MCHC 32.8 30.0 - 36.0 g/dL   RDW 43.8 88.7 - 57.9 %   Platelets 206 150 - 400 K/uL   nRBC 0.0 0.0 - 0.2 %   Neutrophils Relative % 49 %   Neutro Abs 3.7 1.7 - 7.7 K/uL   Lymphocytes Relative 36 %   Lymphs Abs 2.7 0.7 - 4.0 K/uL   Monocytes Relative 9 %   Monocytes Absolute 0.7 0.1 - 1.0 K/uL   Eosinophils Relative 5 %   Eosinophils Absolute 0.4 0.0 - 0.5 K/uL   Basophils Relative 1 %   Basophils Absolute 0.1 0.0 - 0.1 K/uL   Immature Granulocytes 0 %   Abs Immature Granulocytes 0.02 0.00 - 0.07 K/uL  Valproic acid level     Status: Abnormal   Collection Time: 12/07/18  9:25 PM  Result Value Ref Range   Valproic Acid Lvl 30 (L) 50.0 - 100.0 ug/mL  I-Stat beta hCG blood, ED     Status: None   Collection Time: 12/07/18  9:40 PM  Result Value Ref Range   I-stat hCG, quantitative <5.0 <5 mIU/mL   Comment 3           Imaging Studies: Dg Hip Unilat With Pelvis 2-3 Views Left  Result Date: 12/07/2018 CLINICAL DATA:  Right leg pain and left hip pain after a fall. EXAM: DG HIP (WITH OR WITHOUT PELVIS) 2-3V LEFT; DG HIP (WITH OR WITHOUT PELVIS) 2-3V RIGHT COMPARISON:  None. FINDINGS: AP view of the pelvis with two views of both hips. Pelvis appears intact. SI joints and symphysis pubis are not displaced. Degenerative changes in the lower lumbar spine. Right hip demonstrates postoperative changes with right total hip arthroplasty using long-stem femoral component. Cerclage wires are present. Components appear well seated. Callus formation  along the proximal shaft. No evidence suggesting acute fracture or dislocation. Left hip demonstrates postoperative changes with intramedullary rod and compression screw fixation of the femoral neck and shaft. The intramedullary rod extends below the field of view. Visualized hardware appear intact. Callus formation demonstrated around the inter trochanteric region. Heterotopic ossification over the greater trochanter. No acute fracture or dislocation is identified. IMPRESSION: Postoperative changes in both hips. No acute bony abnormalities. Electronically Signed   By: Burman Nieves M.D.   On: 12/07/2018 19:41   Dg Hip Unilat  With Pelvis 2-3 Views Right  Result Date: 12/07/2018 CLINICAL DATA:  Right leg pain and left hip pain after a fall. EXAM: DG HIP (WITH OR WITHOUT PELVIS) 2-3V LEFT; DG HIP (WITH OR WITHOUT PELVIS) 2-3V RIGHT COMPARISON:  None. FINDINGS: AP view of the pelvis with two views of both hips. Pelvis appears intact. SI joints and symphysis pubis are not displaced. Degenerative changes in the lower lumbar spine. Right hip demonstrates postoperative changes with right total hip arthroplasty using long-stem femoral component. Cerclage wires are present. Components appear well seated. Callus formation along the proximal shaft. No evidence suggesting acute fracture or dislocation. Left hip demonstrates postoperative changes with intramedullary rod and compression screw fixation of the femoral neck and shaft. The intramedullary rod extends below the field of view. Visualized hardware appear intact. Callus formation demonstrated around the inter trochanteric region. Heterotopic ossification over the greater trochanter. No acute fracture or dislocation is identified. IMPRESSION: Postoperative changes in both hips. No acute bony abnormalities. Electronically Signed   By: Burman Nieves M.D.   On: 12/07/2018 19:41    ED COURSE and MDM  Nursing notes and initial  vitals signs, including pulse  oximetry, reviewed.  Vitals:   12/07/18 1812 12/07/18 2002 12/07/18 2230 12/07/18 2233  BP:  123/90  118/79  Pulse:  78  67  Resp:  16  16  Temp:    98.3 F (36.8 C)  TempSrc:    Oral  SpO2:  97% 98% 100%  Weight: 74.8 kg     Height:  (1.702 m)      No evidence of significant injury on exam or radiographs.  Patient given 500 mg of valproate IV for subtherapeutic valproate level.  PROCEDURES    ED DIAGNOSES     ICD-10-CM   1. Seizure (HCC) R56.9   2. Fall due to seizure [facility]) R56.9    W19.XXXA   3. Seizure secondary to subtherapeutic anticonvulsant medication (HCC) R56.9    Z79.899        Paula Libra, MD 12/08/18 0004

## 2018-12-13 ENCOUNTER — Ambulatory Visit
Admission: RE | Admit: 2018-12-13 | Discharge: 2018-12-13 | Disposition: A | Discharge status: Home / Self Care | Payer: Medicare Other | Source: Ambulatory Visit | Attending: Family Medicine | Admitting: Family Medicine

## 2018-12-13 ENCOUNTER — Other Ambulatory Visit: Payer: Self-pay | Admitting: Family Medicine

## 2018-12-13 DIAGNOSIS — M654 Radial styloid tenosynovitis [de Quervain]: Secondary | ICD-10-CM

## 2019-01-31 ENCOUNTER — Ambulatory Visit: Payer: Medicare Other | Admitting: Podiatry

## 2019-02-02 ENCOUNTER — Ambulatory Visit: Payer: Medicare Other | Admitting: Podiatry

## 2019-05-02 ENCOUNTER — Ambulatory Visit: Payer: Medicare Other | Admitting: Podiatry

## 2019-06-15 ENCOUNTER — Emergency Department (HOSPITAL_COMMUNITY)
Admission: EM | Admit: 2019-06-15 | Discharge: 2019-06-15 | Disposition: A | Discharge status: Home / Self Care | Payer: Medicare Other | Attending: Emergency Medicine | Admitting: Emergency Medicine

## 2019-06-15 ENCOUNTER — Encounter (HOSPITAL_COMMUNITY): Payer: Self-pay

## 2019-06-15 ENCOUNTER — Emergency Department (HOSPITAL_COMMUNITY): Payer: Medicare Other

## 2019-06-15 ENCOUNTER — Other Ambulatory Visit: Payer: Self-pay

## 2019-06-15 DIAGNOSIS — M25561 Pain in right knee: Secondary | ICD-10-CM | POA: Insufficient documentation

## 2019-06-15 DIAGNOSIS — Y939 Activity, unspecified: Secondary | ICD-10-CM | POA: Diagnosis not present

## 2019-06-15 DIAGNOSIS — I10 Essential (primary) hypertension: Secondary | ICD-10-CM | POA: Insufficient documentation

## 2019-06-15 DIAGNOSIS — Y92129 Unspecified place in nursing home as the place of occurrence of the external cause: Secondary | ICD-10-CM | POA: Insufficient documentation

## 2019-06-15 DIAGNOSIS — Z79899 Other long term (current) drug therapy: Secondary | ICD-10-CM | POA: Diagnosis not present

## 2019-06-15 DIAGNOSIS — W010XXA Fall on same level from slipping, tripping and stumbling without subsequent striking against object, initial encounter: Secondary | ICD-10-CM | POA: Insufficient documentation

## 2019-06-15 DIAGNOSIS — Y999 Unspecified external cause status: Secondary | ICD-10-CM | POA: Diagnosis not present

## 2019-06-15 DIAGNOSIS — W19XXXA Unspecified fall, initial encounter: Secondary | ICD-10-CM

## 2019-06-15 NOTE — ED Notes (Signed)
Pt refused discharge vital signs

## 2019-06-15 NOTE — ED Provider Notes (Signed)
[facility]-EMERGENCY DEPT Provider Note   CSN: [id number] Arrival date & time: 06/15/19  1537     History   Chief Complaint Chief Complaint  Patient presents with  . Fall    HPI Katherine Wolf is a 48 y.o. female w/ a hx of cerebral palsy, seizures, schizophrenia, & HTN who presents to the ED from her group home facility status post witnessed fall by staff shortly prior to arrival.  Per group home staff member patient tripped and fell onto her right knee.  They believe she bumped her head but did not lose consciousness.  Patient denies lightheadedness, dizziness, chest pain, or dyspnea prior to fall.  She states her only complaint at this time is her right knee pain, moderate in severity, worse with movement.  Denies numbness, tingling, or weakness.  Patient does not take blood thinners.  Group home staff is at bedside.     HPI  Past Medical History:  Diagnosis Date  . Cerebral palsy (HCC)   . Hip fracture (HCC)   . Osteoporosis   . Seizures [facility])     Patient Active Problem List   Diagnosis Date Noted  . Gynecologic exam normal 08/15/2018  . Elevated d-dimer 05/24/2018  . Pain of right lower extremity 05/23/2018  . Anxiety 01/12/2018  . Cerebral palsy (HCC) 01/12/2018  . 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 GERD (gastroesophageal reflux disease) 01/12/2018  . Hip fracture (HCC) 01/12/2018  . Hypertension 01/12/2018  . Osteopenia of multiple sites 01/12/2018  . Schizophrenia (HCC) 01/12/2018  . Seizures (HCC) 01/12/2018  . Well woman exam 01/12/2018  . 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 Periprosthetic fracture around internal prosthetic right hip joint (HCC) 09/25/2017    History reviewed. No pertinent surgical history.   OB History   No obstetric history on file.      Home Medications    Prior to Admission medications   Medication Sig Start Date End Date Taking? Authorizing Provider  acetaminophen (TYLENOL) 325 MG  tablet Take 650 mg by mouth at bedtime.     [provider]  alum & mag hydroxide-simeth (MAALOX/MYLANTA) 200-200-20 MG/5ML suspension Take 15 mLs by mouth every 6 (six) hours as needed for indigestion or heartburn.    [provider]  Calcium Carbonate-Vitamin D 600-400 MG-UNIT tablet Take by mouth.    [provider]  cholecalciferol (VITAMIN D3) 25 MCG (1000 UT) tablet Take 1,000 Units by mouth daily.    [provider]  citalopram (CELEXA) 20 MG tablet Take 30 mg by mouth daily.  06/24/18   [provider]  clindamycin (CLEOCIN) 150 MG capsule Take by mouth. 06/22/18   [provider]  diazepam (DIASTAT ACUDIAL) 10 MG GEL Place rectally as needed for seizure.    [provider]  diphenhydrAMINE (BENADRYL) 25 mg capsule Take 25 mg by mouth every 4 (four) hours as needed.    [provider]  divalproex (DEPAKOTE) 250 MG DR tablet Take 250 mg by mouth 2 (two) times daily. 06/25/18   [provider]  docusate sodium (COLACE) 50 MG capsule Take 50 mg by mouth 2 (two) times daily.    [provider]  doxycycline (VIBRA-TABS) 100 MG tablet Take 100 mg by mouth 2 (two) times daily. 11/01/18   [provider]  esomeprazole (NEXIUM) 20 MG capsule Take 20 mg by mouth daily.  06/05/18   [provider]  famotidine (PEPCID) 20 MG tablet Take by mouth. 06/05/18  [provider]  guaifenesin (ROBITUSSIN) 100 MG/5ML syrup Take 15 mLs by mouth 3 (three) times daily as needed for cough.    [provider]  hydrOXYzine (ATARAX/VISTARIL) 25 MG tablet Take 50 mg by mouth 3 (three) times daily. 07/01/18   [provider]  ibuprofen (ADVIL,MOTRIN) 200 MG tablet Take 200 mg by mouth every 6 (six) hours as needed.    [provider]  loperamide (IMODIUM A-D) 2 MG tablet Take 2 mg by mouth as needed for diarrhea or loose stools.    [provider]  magnesium hydroxide (MILK OF  MAGNESIA) 400 MG/5ML suspension Take 30 mLs by mouth daily as needed for mild constipation.    [provider]  metoprolol tartrate (LOPRESSOR) 25 MG tablet Take 25 mg by mouth 2 (two) times daily. 06/24/18   [provider]  OLANZapine (ZYPREXA) 2.5 MG tablet Take 2.5 mg by mouth at bedtime. 06/23/18   [provider]  Olopatadine HCl 0.2 % SOLN every morning.     [provider]  pantoprazole (PROTONIX) 20 MG tablet Take 20 mg by mouth daily.    [provider]  promethazine (PHENERGAN) 25 MG tablet Take 25 mg by mouth every 6 (six) hours as needed for nausea or vomiting.    [provider]  senna-docusate (SENOKOT-S) 8.6-50 MG tablet Take by mouth.    [provider]  VIMPAT 200 MG TABS tablet Take 200 mg by mouth 2 (two) times daily.  07/10/18   [provider]    Family History History reviewed. No pertinent family history.  Social History Social History   Tobacco Use  . Smoking status: Never Smoker  . Smokeless tobacco: Never Used  Substance Use Topics  . Alcohol use: Not on file  . Drug use: Not on file     Allergies   Penicillins   Review of Systems Review of Systems  Constitutional: Negative for chills and fever.  Respiratory: Negative for shortness of breath.   Cardiovascular: Negative for chest pain.  Gastrointestinal: Negative for vomiting.  Musculoskeletal: Positive for arthralgias. Negative for neck pain.  Neurological: Negative for weakness, numbness and headaches.     Physical Exam Updated Vital Signs BP 112/75   Pulse 61   Temp 98.2 F (36.8 C) (Oral)   Resp 18   SpO2 95%   Physical Exam Vitals signs and nursing note reviewed.  Constitutional:      General: She is not in acute distress.    Appearance: She is not ill-appearing or toxic-appearing.  HENT:     Head: Normocephalic and atraumatic.     Comments: No raccoon eyes or battle sign.    Ears:     Comments: No  hemotympanum.    Mouth/Throat:     Comments: Uvula midline. Eyes:     Extraocular Movements: Extraocular movements intact.     Pupils: Pupils are equal, round, and reactive to light.  Cardiovascular:     Rate and Rhythm: Normal rate and regular rhythm.     Pulses:          Dorsalis pedis pulses are 2+ on the right side and 2+ on the left side.       Posterior tibial pulses are 2+ on the right side and 2+ on the left side.  Pulmonary:     Effort: Pulmonary effort is normal. No respiratory distress.     Breath sounds: Normal breath sounds.  Chest:     Chest wall: No tenderness.  Abdominal:     General: There is no distension.     Palpations: Abdomen is soft.     Tenderness: There is no abdominal tenderness.  Musculoskeletal:     Comments: Upper extremities: Intact active range of motion throughout.  No point/focal bony tenderness Back: No midline tenderness to palpation or palpable step-off. Lower extremities: No obvious deformity, appreciable swelling, edema, erythema, ecchymosis, warmth, or open wounds. Patient has intact AROM to bilateral hips, knees, ankles, and all digits. Tender to palpation to the R anterior knee.   Skin:    General: Skin is warm and dry.     Capillary Refill: Capillary refill takes less than 2 seconds.  Neurological:     Mental Status: She is alert.     Comments: Alert. Clear speech. Sensation grossly intact to bilateral lower extremities. 5/5 strength with plantar/dorsiflexion bilaterally. Patient ambulatory.  Psychiatric:        Mood and Affect: Mood normal.        Behavior: Behavior normal.      ED Treatments / Results  Labs (all labs ordered are listed, but only abnormal results are displayed) Labs Reviewed - No data to display  EKG None  Radiology Dg Knee Complete 4 Views Right  Result Date: 06/15/2019 CLINICAL DATA:  Fall EXAM: RIGHT KNEE - COMPLETE 4+ VIEW COMPARISON:  None. FINDINGS: No fracture or malalignment. Joint spaces are relatively  maintained. No large knee effusion. Partially visualized femoral stem in the shaft of the femur IMPRESSION: No acute osseous abnormality Electronically Signed   By: Donavan Foil M.D.   On: 06/15/2019 18:31    Procedures Procedures (including critical care time)  Medications Ordered in ED Medications - No data to display   Initial Impression / Assessment and Plan / ED Course  I have reviewed the triage vital signs and the nursing notes.  Pertinent labs & imaging results that were available during my care of the patient were reviewed by me and considered in my medical decision making (see chart for details).   Patient presents to the emergency department status post mechanical fall with complaints of right knee pain.  Patient nontoxic-appearing, no apparent distress, vitals WNL.  No signs of serious head, neck, back, or intrathoracic/abdominal injury.  Seems to have isolated injury to the right knee.  No open wounds, no obvious deformity, range of motion intact.  Anterior knee tenderness to palpation.  Neurovascularly intact distally.  Xray negative for fx/dislocation. Placed in knee sleeve. Recommendation for tylenol/motrin, PRICE, & PCP/ortho follow up. I discussed results, treatment plan, need for follow-up, and return precautions with the patient & group home staff. Provided opportunity for questions, patient and staff confirmed understanding and are in agreement with plan.   Final Clinical Impressions(s) / ED Diagnoses   Final diagnoses:  Fall, initial encounter    ED Discharge Orders    None       Leafy Kindle 06/15/19 Margretta Sidle, MD 06/19/19 1006

## 2019-06-15 NOTE — ED Triage Notes (Signed)
Pt BIB EMS from [facility]. Pt reports having a fall yesterday and hurting her right knee.

## 2019-06-15 NOTE — Discharge Instructions (Addendum)
Please read and follow all provided instructions.  You have been seen today for a right knee injury  Tests performed today include: An x-ray of the affected area - does NOT show any broken bones or dislocations.  Vital signs. See below for your results today.   Home care instructions: -- *PRICE in the first 24-48 hours after injury: Protect (with brace, splint, sling), if given by your provider Rest Ice- Do not apply ice pack directly to your skin, place towel or similar between your skin and ice/ice pack. Apply ice for 20 min, then remove for 40 min while awake Compression- Wear brace, elastic bandage, splint as directed by your provider Elevate affected extremity above the level of your heart when not walking around for the first 24-48 hours   Medications:  Please take tylenol/motrin per over the counter dosing to assist with pain.   Follow-up instructions: Please follow-up with your primary care provider or the provided orthopedic physician (bone specialist) if you continue to have significant pain in 1 week. In this case you may have a more severe injury that requires further care.   Return instructions:  Please return if your digits or extremity are numb or tingling, appear gray or blue, or you have severe pain (also elevate the extremity and loosen splint or wrap if you were given one) Please return if you have redness or fevers.  Please return to the Emergency Department if you experience worsening symptoms.  Please return if you have any other emergent concerns. Additional Information:  Your vital signs today were: BP 112/75    Pulse 61    Temp 98.2 F (36.8 C) (Oral)    Resp 18    SpO2 95%  If your blood pressure (BP) was elevated above 135/85 this visit, please have this repeated by your doctor within one month. ---------------

## 2019-06-17 ENCOUNTER — Emergency Department (HOSPITAL_COMMUNITY): Payer: Medicare Other

## 2019-06-17 ENCOUNTER — Encounter (HOSPITAL_COMMUNITY): Payer: Self-pay

## 2019-06-17 ENCOUNTER — Emergency Department (HOSPITAL_COMMUNITY)
Admission: EM | Admit: 2019-06-17 | Discharge: 2019-06-17 | Disposition: A | Discharge status: Home / Self Care | Payer: Medicare Other | Attending: Emergency Medicine | Admitting: Emergency Medicine

## 2019-06-17 ENCOUNTER — Other Ambulatory Visit: Payer: Self-pay

## 2019-06-17 DIAGNOSIS — Y92199 Unspecified place in other specified residential institution as the place of occurrence of the external cause: Secondary | ICD-10-CM | POA: Insufficient documentation

## 2019-06-17 DIAGNOSIS — Z79899 Other long term (current) drug therapy: Secondary | ICD-10-CM | POA: Insufficient documentation

## 2019-06-17 DIAGNOSIS — Y999 Unspecified external cause status: Secondary | ICD-10-CM | POA: Insufficient documentation

## 2019-06-17 DIAGNOSIS — S022XXA Fracture of nasal bones, initial encounter for closed fracture: Secondary | ICD-10-CM | POA: Diagnosis not present

## 2019-06-17 DIAGNOSIS — M25561 Pain in right knee: Secondary | ICD-10-CM | POA: Diagnosis not present

## 2019-06-17 DIAGNOSIS — S0992XA Unspecified injury of nose, initial encounter: Secondary | ICD-10-CM | POA: Diagnosis present

## 2019-06-17 DIAGNOSIS — Y939 Activity, unspecified: Secondary | ICD-10-CM | POA: Diagnosis not present

## 2019-06-17 DIAGNOSIS — W19XXXA Unspecified fall, initial encounter: Secondary | ICD-10-CM | POA: Insufficient documentation

## 2019-06-17 HISTORY — DX: Unspecified intellectual disabilities: F79

## 2019-06-17 HISTORY — DX: Anxiety disorder, unspecified: F41.9

## 2019-06-17 HISTORY — DX: Schizophrenia, unspecified: F20.9

## 2019-06-17 HISTORY — DX: Depression, unspecified: F32.A

## 2019-06-17 LAB — URINALYSIS, ROUTINE W REFLEX MICROSCOPIC
Bilirubin Urine: NEGATIVE
Glucose, UA: NEGATIVE mg/dL
Hgb urine dipstick: NEGATIVE
Ketones, ur: NEGATIVE mg/dL
Leukocytes,Ua: NEGATIVE
Nitrite: NEGATIVE
Protein, ur: NEGATIVE mg/dL
Specific Gravity, Urine: 1.01 (ref 1.005–1.030)
pH: 8 (ref 5.0–8.0)

## 2019-06-17 LAB — BASIC METABOLIC PANEL
Anion gap: 12 (ref 5–15)
BUN: 15 mg/dL (ref 6–20)
CO2: 23 mmol/L (ref 22–32)
Calcium: 9.1 mg/dL (ref 8.9–10.3)
Chloride: 104 mmol/L (ref 98–111)
Creatinine, Ser: 0.44 mg/dL (ref 0.44–1.00)
GFR calc Af Amer: >60 mL/min (ref >60)
GFR calc non Af Amer: >60 mL/min (ref >60)
Glucose, Bld: 87 mg/dL (ref 70–99)
Potassium: 3.8 mmol/L (ref 3.5–5.1)
Sodium: 139 mmol/L (ref 135–145)

## 2019-06-17 LAB — CBC WITH DIFFERENTIAL/PLATELET
Abs Immature Granulocytes: 0.02 10*3/uL (ref 0.00–0.07)
Basophils Absolute: 0 10*3/uL (ref 0.0–0.1)
Basophils Relative: 1 %
Eosinophils Absolute: 0.1 10*3/uL (ref 0.0–0.5)
Eosinophils Relative: 1 %
HCT: 37.3 % (ref 36.0–46.0)
Hemoglobin: 12.2 g/dL (ref 12.0–15.0)
Immature Granulocytes: 0 %
Lymphocytes Relative: 13 %
Lymphs Abs: 1.1 10*3/uL (ref 0.7–4.0)
MCH: 31.1 pg (ref 26.0–34.0)
MCHC: 32.7 g/dL (ref 30.0–36.0)
MCV: 95.2 fL (ref 80.0–100.0)
Monocytes Absolute: 0.7 10*3/uL (ref 0.1–1.0)
Monocytes Relative: 8 %
Neutro Abs: 6.8 10*3/uL (ref 1.7–7.7)
Neutrophils Relative %: 77 %
Platelets: 192 10*3/uL (ref 150–400)
RBC: 3.92 MIL/uL (ref 3.87–5.11)
RDW: 13 % (ref 11.5–15.5)
WBC: 8.8 10*3/uL (ref 4.0–10.5)
nRBC: 0 % (ref 0.0–0.2)

## 2019-06-17 LAB — VALPROIC ACID LEVEL: Valproic Acid Lvl: 43 ug/mL — ABNORMAL LOW (ref 50.0–100.0)

## 2019-06-17 NOTE — Discharge Instructions (Addendum)
Nasal bone fracture will need follow up with ENT, please call the office on Tuesday to schedule an appointment. Avoid blowing the nose. Apply ice to the nose for 20 minutes at a time to help with pain and swelling. Motrin and Tylenol as needed as directed for pain.  Depakote level is a little low today at 43, dose adjust to therapeutic level.

## 2019-06-17 NOTE — ED Provider Notes (Signed)
[facility] DEPT Provider Note   CSN: [id number] Arrival date & time: 06/17/19  0744     History   Chief Complaint Chief Complaint  Patient presents with  . Knee Pain    HPI Katherine Wolf is a 48 y.o. female.     48 year old female with history of cerebral palsy and schizophrenia, osteoporosis, seizures brought in by EMS from group home for injuries from a fall.  Per EMS records, patient fell yesterday, group home concerned about abrasion to nose and right knee pain, also more frequent falls. Patient reports she fell today, has been falling frequently, has pain in her knee and her nose, poor historian and unable to give all the details of her fall. Patient seen in the ER 2 days ago after a fall, x-ray right knee normal at that time.     Past Medical History:  Diagnosis Date  . Anxiety   . Cerebral palsy ([place])   . Depression   . Hip fracture ([place])   . Intellectual disability   . Osteoporosis   . Schizophrenia ([place])   . Seizures [facility])     Patient Active Problem List   Diagnosis Date Noted  . Gynecologic exam normal 08/15/2018  . Elevated d-dimer 05/24/2018  . Pain of right lower extremity 05/23/2018  . Anxiety 01/12/2018  . Cerebral palsy ([place]) 01/12/2018  . 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 GERD (gastroesophageal reflux disease) 01/12/2018  . Hip fracture ([place]) 01/12/2018  . Hypertension 01/12/2018  . Osteopenia of multiple sites 01/12/2018  . Schizophrenia ([place]) 01/12/2018  . Seizures ([place]) 01/12/2018  . Well woman exam 01/12/2018  . 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 Periprosthetic fracture around internal prosthetic right hip joint ([place]) 09/25/2017    History reviewed. No pertinent surgical history.   OB History   No obstetric history on file.      Home Medications    Prior to Admission medications   Medication Sig Start Date End Date Taking? Authorizing Provider  acetaminophen (TYLENOL) 325 MG  tablet Take 650 mg by mouth at bedtime.    Yes [provider]  Calcium Carbonate-Vitamin D 600-400 MG-UNIT tablet Take by mouth.   Yes [provider]  cholecalciferol (VITAMIN D3) 25 MCG (1000 UT) tablet Take 1,000 Units by mouth daily.   Yes [provider]  citalopram (CELEXA) 20 MG tablet Take 30 mg by mouth daily.  06/24/18  Yes [provider]  diazepam (DIASTAT ACUDIAL) 10 MG GEL Place rectally as needed for seizure.   Yes [provider]  divalproex (DEPAKOTE) 250 MG DR tablet Take 250 mg by mouth every morning.  06/25/18  Yes [provider]  divalproex (DEPAKOTE) 500 MG DR tablet Take 500 mg by mouth at bedtime.   Yes [provider]  docusate sodium (COLACE) 50 MG capsule Take 50 mg by mouth 2 (two) times daily.   Yes [provider]  hydrOXYzine (VISTARIL) 50 MG capsule Take 50 mg by mouth 3 (three) times daily as needed.   Yes [provider]  metoprolol tartrate (LOPRESSOR) 25 MG tablet Take 25 mg by mouth 2 (two) times daily. 06/24/18  Yes [provider]  OLANZapine (ZYPREXA) 2.5 MG tablet Take 2.5 mg by mouth at bedtime. 06/23/18  Yes [provider]  Olopatadine HCl 0.2 % SOLN Place 1 drop into both eyes every morning.    Yes [provider]  pantoprazole (PROTONIX) 20 MG tablet Take 20 mg  by mouth daily.   Yes [provider]  promethazine (PHENERGAN) 25 MG tablet Take 25 mg by mouth every 6 (six) hours as needed for nausea or vomiting.   Yes [provider]  VIMPAT 200 MG TABS tablet Take 200 mg by mouth 2 (two) times daily.  07/10/18  Yes [provider]    Family History No family history on file.  Social History Social History   Tobacco Use  . Smoking status: Never Smoker  . Smokeless tobacco: Never Used  Substance Use Topics  . Alcohol use: Never    Frequency: Never  . Drug use: Never     Allergies   Penicillins   Review of  Systems Review of Systems  Unable to perform ROS: Other (poor historian)  Gastrointestinal: Negative for vomiting.  Musculoskeletal: Positive for arthralgias. Negative for back pain, joint swelling and neck pain.  Skin: Positive for wound.     Physical Exam Updated Vital Signs BP 109/81   Pulse (!) 102   Temp 98.2 F (36.8 C) (Oral)   Resp 17   Ht 5\' 6"  (1.676 m)   Wt 79.4 kg   SpO2 97%   BMI 28.25 kg/m   Physical Exam Vitals signs and nursing note reviewed.  Constitutional:      General: She is not in acute distress.    Appearance: She is well-developed. She is not diaphoretic.  HENT:     Head: Normocephalic.     Nose: Signs of injury and nasal tenderness present. No septal deviation.     Right Nostril: Epistaxis present. No septal hematoma.     Left Nostril: No epistaxis or septal hematoma.   Eyes:     Extraocular Movements: Extraocular movements intact.     Pupils: Pupils are equal, round, and reactive to light.  Neck:     Musculoskeletal: Normal range of motion and neck supple. No muscular tenderness.  Cardiovascular:     Rate and Rhythm: Normal rate and regular rhythm.     Pulses: Normal pulses.     Heart sounds: Normal heart sounds.  Pulmonary:     Effort: Pulmonary effort is normal.     Breath sounds: Normal breath sounds.  Abdominal:     Palpations: Abdomen is soft.     Tenderness: There is no abdominal tenderness.  Musculoskeletal:        General: No swelling, tenderness, deformity or signs of injury.     Right knee: Normal. She exhibits normal range of motion, no swelling, no effusion and no ecchymosis. No tenderness found.     Right lower leg: No edema.     Left lower leg: No edema.     Comments: Able to move extremities without pain. Knee sleeve on right knee, no ecchymosis, no effusion, no tenderness with ROM right knee.  Skin:    General: Skin is warm and dry.     Findings: No erythema or rash.  Neurological:     General: No focal deficit  present.     Mental Status: She is alert.     GCS: GCS eye subscore is 4. GCS verbal subscore is 4. GCS motor subscore is 6.     Sensory: No sensory deficit.     Motor: No weakness.  Psychiatric:        Behavior: Behavior normal.      ED Treatments / Results  Labs (all labs ordered are listed, but only abnormal results are displayed) Labs Reviewed  URINALYSIS, ROUTINE W REFLEX  MICROSCOPIC - Abnormal; Notable for the following components:      Result Value   Color, Urine STRAW (*)    All other components within normal limits  VALPROIC ACID LEVEL - Abnormal; Notable for the following components:   Valproic Acid Lvl 43 (*)    All other components within normal limits  CBC WITH DIFFERENTIAL/PLATELET  BASIC METABOLIC PANEL    EKG None  Radiology Ct Head Wo Contrast  Result Date: 06/17/2019 CLINICAL DATA:  Facial trauma with fracture suspected EXAM: CT HEAD WITHOUT CONTRAST CT MAXILLOFACIAL WITHOUT CONTRAST TECHNIQUE: Multidetector CT imaging of the head and maxillofacial structures were performed using the standard protocol without intravenous contrast. Multiplanar CT image reconstructions of the maxillofacial structures were also generated. COMPARISON:  None. FINDINGS: CT HEAD FINDINGS Brain: No evidence of acute infarction, hemorrhage, hydrocephalus, extra-axial collection or mass lesion/mass effect. Vascular: No hyperdense vessel or unexpected calcification. Skull: Negative for fracture CT MAXILLOFACIAL FINDINGS Osseous: Comminuted bilateral nasal arch fracture without depression. On coronal reformats there is upper nasal septum fracture. The lower nasal septum is significantly deviated towards the right without fracture defect seen at this level. Orbits: No evidence of injury Sinuses: Negative for hemosinus. Soft tissues: Extensive soft tissue swelling over the nose with soft tissue emphysema. Swelling effaces the nasal cavity. IMPRESSION: 1. No evidence of intracranial injury. 2.  Nondepressed bilateral nasal bone fracture. Upper nasal septum fracture. Electronically Signed   By: Marnee SpringJonathon  Watts M.D.   On: 06/17/2019 09:17   Dg Knee Complete 4 Views Right  Result Date: 06/17/2019 CLINICAL DATA:  Fall with pain. Right anterior knee injury an abrasion. EXAM: RIGHT KNEE - COMPLETE 4+ VIEW COMPARISON:  Right knee radiographs 06/15/2019. FINDINGS: No evidence of fracture, dislocation, or joint effusion. No evidence of arthropathy or other focal bone abnormality. Soft tissues are unremarkable. IMPRESSION: No acute osseous abnormality in the right knee. Electronically Signed   By: Emmaline KluverNancy  Ballantyne M.D.   On: 06/17/2019 11:28   Dg Knee Complete 4 Views Right  Result Date: 06/15/2019 CLINICAL DATA:  Fall EXAM: RIGHT KNEE - COMPLETE 4+ VIEW COMPARISON:  None. FINDINGS: No fracture or malalignment. Joint spaces are relatively maintained. No large knee effusion. Partially visualized femoral stem in the shaft of the femur IMPRESSION: No acute osseous abnormality Electronically Signed   By: Jasmine PangKim  Fujinaga M.D.   On: 06/15/2019 18:31   Ct Maxillofacial Wo Cm  Result Date: 06/17/2019 CLINICAL DATA:  Facial trauma with fracture suspected EXAM: CT HEAD WITHOUT CONTRAST CT MAXILLOFACIAL WITHOUT CONTRAST TECHNIQUE: Multidetector CT imaging of the head and maxillofacial structures were performed using the standard protocol without intravenous contrast. Multiplanar CT image reconstructions of the maxillofacial structures were also generated. COMPARISON:  None. FINDINGS: CT HEAD FINDINGS Brain: No evidence of acute infarction, hemorrhage, hydrocephalus, extra-axial collection or mass lesion/mass effect. Vascular: No hyperdense vessel or unexpected calcification. Skull: Negative for fracture CT MAXILLOFACIAL FINDINGS Osseous: Comminuted bilateral nasal arch fracture without depression. On coronal reformats there is upper nasal septum fracture. The lower nasal septum is significantly deviated towards the  right without fracture defect seen at this level. Orbits: No evidence of injury Sinuses: Negative for hemosinus. Soft tissues: Extensive soft tissue swelling over the nose with soft tissue emphysema. Swelling effaces the nasal cavity. IMPRESSION: 1. No evidence of intracranial injury. 2. Nondepressed bilateral nasal bone fracture. Upper nasal septum fracture. Electronically Signed   By: Marnee SpringJonathon  Watts M.D.   On: 06/17/2019 09:17    Procedures Procedures (including critical care time)  Medications Ordered in ED Medications - No data to display   Initial Impression / Assessment and Plan / ED Course  I have reviewed the triage vital signs and the nursing notes.  Pertinent labs & imaging results that were available during my care of the patient were reviewed by me and considered in my medical decision making (see chart for details).  Clinical Course as of Jun 16 1237  Sat Jun 17, 2019  2637 48 year old female sent via EMS by group home for fall injuring her nose and right knee.  Patient is alert, states that she fell today, reports pain in her nose as well as her knee, no other injuries identified.  On exam patient has ecchymosis and swelling with tenderness to her nose small bleeding on the right nostril.  Extremity exam is unremarkable.  CT head completed due to fall today as well as report of multiple falls recently, unremarkable.  CT face shows nasal bone fractures.  No septal hematoma on exam.  X-ray of the right knee is unremarkable.  Lab work completed due to report of multiple falls, no significant findings, normal CBC and BMP, urinalysis unremarkable, Depakote level subtherapeutic at 43, recommend dose adjust accordingly.  Patient should follow-up with ENT, call office on Tuesday for appointment, recommend Motrin Tylenol and apply ice to help with pain and swelling.   [LM]    Clinical Course User Index [LM] Jeannie Fend, PA-C      Final Clinical Impressions(s) / ED Diagnoses    Final diagnoses:  Fall, initial encounter  Closed fracture of nasal bone, initial encounter    ED Discharge Orders    None       Jeannie Fend, PA-C 06/17/19 1238    Milagros Loll, MD 06/18/19 [phone]

## 2019-06-17 NOTE — ED Notes (Signed)
Patient prefers to be called Katherine Wolf

## 2019-06-17 NOTE — ED Notes (Signed)
Pt returned from X Ray.

## 2019-06-17 NOTE — ED Notes (Signed)
Pt refuses in and out cath  

## 2019-06-17 NOTE — ED Triage Notes (Signed)
Pt arrives [facility] from a group home with a right side knee injury and abrasion to the nose after a mechanical fall yesterday. Pt is said to have been falling more often recently- no LOC reported.

## 2019-06-21 DIAGNOSIS — J342 Deviated nasal septum: Secondary | ICD-10-CM | POA: Insufficient documentation

## 2019-06-21 DIAGNOSIS — S022XXA Fracture of nasal bones, initial encounter for closed fracture: Secondary | ICD-10-CM | POA: Insufficient documentation

## 2019-06-27 ENCOUNTER — Emergency Department (HOSPITAL_COMMUNITY): Payer: Medicare Other

## 2019-06-27 ENCOUNTER — Telehealth: Payer: Self-pay

## 2019-06-27 ENCOUNTER — Other Ambulatory Visit: Payer: Self-pay

## 2019-06-27 ENCOUNTER — Emergency Department (HOSPITAL_COMMUNITY)
Admission: EM | Admit: 2019-06-27 | Discharge: 2019-06-27 | Disposition: A | Discharge status: Home / Self Care | Payer: Medicare Other | Attending: Emergency Medicine | Admitting: Emergency Medicine

## 2019-06-27 ENCOUNTER — Encounter (HOSPITAL_COMMUNITY): Payer: Self-pay

## 2019-06-27 DIAGNOSIS — Z96641 Presence of right artificial hip joint: Secondary | ICD-10-CM | POA: Insufficient documentation

## 2019-06-27 DIAGNOSIS — R296 Repeated falls: Secondary | ICD-10-CM | POA: Insufficient documentation

## 2019-06-27 DIAGNOSIS — R569 Unspecified convulsions: Secondary | ICD-10-CM | POA: Diagnosis not present

## 2019-06-27 DIAGNOSIS — I10 Essential (primary) hypertension: Secondary | ICD-10-CM | POA: Insufficient documentation

## 2019-06-27 DIAGNOSIS — W19XXXA Unspecified fall, initial encounter: Secondary | ICD-10-CM

## 2019-06-27 DIAGNOSIS — Z79899 Other long term (current) drug therapy: Secondary | ICD-10-CM | POA: Diagnosis not present

## 2019-06-27 DIAGNOSIS — W010XXA Fall on same level from slipping, tripping and stumbling without subsequent striking against object, initial encounter: Secondary | ICD-10-CM | POA: Diagnosis not present

## 2019-06-27 DIAGNOSIS — S022XXD Fracture of nasal bones, subsequent encounter for fracture with routine healing: Secondary | ICD-10-CM | POA: Diagnosis not present

## 2019-06-27 DIAGNOSIS — M7918 Myalgia, other site: Secondary | ICD-10-CM | POA: Diagnosis present

## 2019-06-27 DIAGNOSIS — G40909 Epilepsy, unspecified, not intractable, without status epilepticus: Secondary | ICD-10-CM

## 2019-06-27 LAB — BASIC METABOLIC PANEL
Anion gap: 10 (ref 5–15)
BUN: 14 mg/dL (ref 6–20)
CO2: 23 mmol/L (ref 22–32)
Calcium: 9 mg/dL (ref 8.9–10.3)
Chloride: 106 mmol/L (ref 98–111)
Creatinine, Ser: 0.57 mg/dL (ref 0.44–1.00)
GFR calc Af Amer: >60 mL/min (ref >60)
GFR calc non Af Amer: >60 mL/min (ref >60)
Glucose, Bld: 90 mg/dL (ref 70–99)
Potassium: 4.1 mmol/L (ref 3.5–5.1)
Sodium: 139 mmol/L (ref 135–145)

## 2019-06-27 LAB — CBC WITH DIFFERENTIAL/PLATELET
Abs Immature Granulocytes: 0.01 10*3/uL (ref 0.00–0.07)
Basophils Absolute: 0 10*3/uL (ref 0.0–0.1)
Basophils Relative: 1 %
Eosinophils Absolute: 0.2 10*3/uL (ref 0.0–0.5)
Eosinophils Relative: 4 %
HCT: 36.3 % (ref 36.0–46.0)
Hemoglobin: 12.2 g/dL (ref 12.0–15.0)
Immature Granulocytes: 0 %
Lymphocytes Relative: 28 %
Lymphs Abs: 1.7 10*3/uL (ref 0.7–4.0)
MCH: 31.1 pg (ref 26.0–34.0)
MCHC: 33.6 g/dL (ref 30.0–36.0)
MCV: 92.6 fL (ref 80.0–100.0)
Monocytes Absolute: 0.7 10*3/uL (ref 0.1–1.0)
Monocytes Relative: 11 %
Neutro Abs: 3.5 10*3/uL (ref 1.7–7.7)
Neutrophils Relative %: 56 %
Platelets: 226 10*3/uL (ref 150–400)
RBC: 3.92 MIL/uL (ref 3.87–5.11)
RDW: 12.7 % (ref 11.5–15.5)
WBC: 6.1 10*3/uL (ref 4.0–10.5)
nRBC: 0 % (ref 0.0–0.2)

## 2019-06-27 LAB — VALPROIC ACID LEVEL: Valproic Acid Lvl: 76 ug/mL (ref 50.0–100.0)

## 2019-06-27 NOTE — Telephone Encounter (Signed)
Cynthia LPN at [facility] would like sooner appt for pt. She stated pt has had seven falls in the last month. With some of the falls she has had seizures. Pt is still on her depakote for seizure prevention. I contacted Amy NP and she approve visit for pt. Contact number at facility is [phone]. FYI

## 2019-06-27 NOTE — ED Notes (Signed)
Called pt's legal guardian.  They did not answer.  pts caregiver states the manager of the group home has been in contact with her today.

## 2019-06-27 NOTE — Discharge Instructions (Signed)
Please call both your primary doctor as well as her neurologist to schedule close follow-up appointment for recheck later this week.  Recommend continuing the current regimen of antiepileptic medications for now.  If she has further falls, any episodes of seizures, recommend return to ER for reassessment.

## 2019-06-27 NOTE — ED Provider Notes (Signed)
[facility]-EMERGENCY DEPT Provider Note   CSN: [id number] Arrival date & time: 06/27/19  1009     History   Chief Complaint No chief complaint on file.   HPI Katherine Wolf is a 48 y.o. female.  Presents emerge department after fall.  Patient had witnessed fall today with facility staff.  Patient tripped, landing on face.  Patient on my interview states she is having some neck pain, left hip pain.  Denies any other acute complaints.  There is no witnessed seizure-like activity today, no bladder or bowel incontinence, no loss of consciousness.  Patient denies any chest pain, shortness of breath, back pain.  Has been able to ambulate without difficulty since fall.  There was episode of brief seizure-like activity noted on Sunday by facility staff, this was reviewed with the on-call provider and decision made to observe patient and was not taken to the hospital at that time.  No seizure-like activity since that time.  No changes in her medication regimen, follows with neurology for prior diagnosis of seizure.     HPI  Past Medical History:  Diagnosis Date   Anxiety    Cerebral palsy (HCC)    Depression    Hip fracture (HCC)    Intellectual disability    Osteoporosis    Schizophrenia (HCC)    Seizures (HCC)     Patient Active Problem List   Diagnosis Date Noted   Gynecologic exam normal 08/15/2018   Elevated d-dimer 05/24/2018   Pain of right lower extremity 05/23/2018   Anxiety 01/12/2018   Cerebral palsy (HCC) 01/12/2018   Delay in development 01/12/2018   GERD (gastroesophageal reflux disease) 01/12/2018   Hip fracture (HCC) 01/12/2018   Hypertension 01/12/2018   Osteopenia of multiple sites 01/12/2018   Schizophrenia (HCC) 01/12/2018   Seizures (HCC) 01/12/2018   Well woman exam 01/12/2018   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 No pertinent surgical history.   OB History   No obstetric history on file.      Home Medications    Prior to Admission medications   Medication Sig Start Date End Date Taking? Authorizing Provider  acetaminophen (TYLENOL) 325 MG tablet Take 650 mg by mouth at bedtime.     [provider]  Calcium Carbonate-Vitamin D 600-400 MG-UNIT tablet Take by mouth.    [provider]  cholecalciferol (VITAMIN D3) 25 MCG (1000 UT) tablet Take 1,000 Units by mouth daily.    [provider]  citalopram (CELEXA) 20 MG tablet Take 30 mg by mouth daily.  06/24/18   [provider]  diazepam (DIASTAT ACUDIAL) 10 MG GEL Place rectally as needed for seizure.    [provider]  divalproex (DEPAKOTE) 250 MG DR tablet Take 250 mg by mouth every morning.  06/25/18   [provider]  divalproex (DEPAKOTE) 500 MG DR tablet Take 500 mg by mouth at bedtime.    [provider]  docusate sodium (COLACE) 50 MG capsule Take 50 mg by mouth 2 (two) times daily.    [provider]  hydrOXYzine (VISTARIL) 50 MG capsule Take 50 mg by mouth 3 (three) times daily as needed.    [provider]  metoprolol tartrate (LOPRESSOR) 25 MG tablet Take 25 mg by mouth 2 (two) times daily. 06/24/18   [provider]  OLANZapine (ZYPREXA) 2.5 MG tablet Take 2.5 mg by  mouth at bedtime. 06/23/18   [provider]  Olopatadine HCl 0.2 % SOLN Place 1 drop into both eyes every morning.     [provider]  pantoprazole (PROTONIX) 20 MG tablet Take 20 mg by mouth daily.    [provider]  promethazine (PHENERGAN) 25 MG tablet Take 25 mg by mouth every 6 (six) hours as needed for nausea or vomiting.    [provider]  VIMPAT 200 MG TABS tablet Take 200 mg by mouth 2 (two) times daily.  07/10/18   [provider]    Family History No family history on file.  Social  History Social History   Tobacco Use   Smoking status: Never Smoker   Smokeless tobacco: Never Used  Substance Use Topics   Alcohol use: Never    Frequency: Never   Drug use: Never     Allergies   Penicillins   Review of Systems Review of Systems  Constitutional: Negative for chills and fever.  HENT: Negative for ear pain and sore throat.   Eyes: Negative for pain and visual disturbance.  Respiratory: Negative for cough and shortness of breath.   Cardiovascular: Negative for chest pain and palpitations.  Gastrointestinal: Negative for abdominal pain and vomiting.  Genitourinary: Negative for dysuria and hematuria.  Musculoskeletal: Negative for arthralgias and back pain.  Skin: Negative for color change and rash.  Neurological: Positive for seizures. Negative for syncope.  All other systems reviewed and are negative.    Physical Exam Updated Vital Signs BP 105/71 (BP Location: Left Arm)    Pulse 80    Temp 98.1 F (36.7 C) (Oral)    Resp 16    Ht 5\' 9"  (1.753 m)    Wt 72.6 kg    SpO2 97%    BMI 23.63 kg/m   Physical Exam Vitals signs and nursing note reviewed.  Constitutional:      General: She is not in acute distress.    Appearance: She is well-developed.  HENT:     Head: Normocephalic.     Comments: Mild ecchymosis noted over bilateral face just inferior to orbits    Nose:     Comments: No nasal septal hematoma    Mouth/Throat:     Comments: No trauma Eyes:     Conjunctiva/sclera: Conjunctivae normal.     Comments: No hyphema  Neck:     Comments: C-collar in place Cardiovascular:     Rate and Rhythm: Normal rate and regular rhythm.     Heart sounds: No murmur.  Pulmonary:     Effort: Pulmonary effort is normal. No respiratory distress.     Breath sounds: Normal breath sounds.  Abdominal:     Palpations: Abdomen is soft.     Tenderness: There is no abdominal tenderness.  Musculoskeletal:     Comments: No tenderness palpation over T, L-spine,  mild tenderness palpation left hip, bilateral knees, no other deformity or trauma noted in bilateral lower extremities, bilateral upper extremities, she has not entirely normal joint range of motion throughout all 4 extremities, sensation and distal pulses intact in all 4 extremities  Skin:    General: Skin is warm and dry.  Neurological:     Mental Status: She is alert.      ED Treatments / Results  Labs (all labs ordered are listed, but only abnormal results are displayed) Labs Reviewed  CBC WITH DIFFERENTIAL/PLATELET  BASIC METABOLIC PANEL  VALPROIC ACID LEVEL    EKG None  Radiology  Ct Head Wo Contrast  Result Date: 06/27/2019 CLINICAL DATA:  Headache, neck pain, facial ecchymosis EXAM: CT HEAD WITHOUT CONTRAST CT MAXILLOFACIAL WITHOUT CONTRAST CT CERVICAL SPINE WITHOUT CONTRAST TECHNIQUE: Multidetector CT imaging of the head, cervical spine, and maxillofacial structures were performed using the standard protocol without intravenous contrast. Multiplanar CT image reconstructions of the cervical spine and maxillofacial structures were also generated. COMPARISON:  06/17/2019 FINDINGS: CT HEAD FINDINGS Brain: No evidence of acute infarction, hemorrhage, hydrocephalus, extra-axial collection or mass lesion/mass effect. Vascular: No hyperdense vessel or unexpected calcification. Skull: Normal. Negative for fracture or focal lesion. Other: None. CT MAXILLOFACIAL FINDINGS Osseous: Increased comminution of minimally depressed bilateral nasal bone fractures compared to 06/17/2019 with slight increase in the degree of depression of the left nasal bone fracture. Unchanged appearance of minimally displaced fracture of the superior nasal septum compared to prior. Orbital walls intact. Mandible intact without fracture or dislocation. Orbits: Negative. No traumatic or inflammatory finding. Sinuses: Clear. Soft tissues: Mild soft tissue swelling over the nasal area. CT CERVICAL SPINE FINDINGS Alignment:  Minimal grade 1 anterolisthesis C3 on C4 is unchanged from prior. Facet alignment is maintained. Skull base and vertebrae: No acute fracture. No primary bone lesion or focal pathologic process. Soft tissues and spinal canal: No prevertebral fluid or swelling. No visible canal hematoma. Disc levels: Intervertebral disc spaces are maintained. There is focal advanced facet arthrosis at C3-4 on the left. Upper chest: Negative. Other: None. IMPRESSION: 1. No acute intracranial findings. 2. Increased comminution and slight increased depression of bilateral nasal bone fractures compared to prior CT 06/17/2019 suggesting acute on subacute fracture. 3. Unchanged appearance of minimally displaced superior nasal septum fracture. 4. No acute fracture or dislocation of the cervical spine. 5. Focal left-sided facet arthropathy at C3-4. Electronically Signed   By: Duanne GuessNicholas  Plundo M.D.   On: 06/27/2019 11:58   Ct Cervical Spine Wo Contrast  Result Date: 06/27/2019 CLINICAL DATA:  Headache, neck pain, facial ecchymosis EXAM: CT HEAD WITHOUT CONTRAST CT MAXILLOFACIAL WITHOUT CONTRAST CT CERVICAL SPINE WITHOUT CONTRAST TECHNIQUE: Multidetector CT imaging of the head, cervical spine, and maxillofacial structures were performed using the standard protocol without intravenous contrast. Multiplanar CT image reconstructions of the cervical spine and maxillofacial structures were also generated. COMPARISON:  06/17/2019 FINDINGS: CT HEAD FINDINGS Brain: No evidence of acute infarction, hemorrhage, hydrocephalus, extra-axial collection or mass lesion/mass effect. Vascular: No hyperdense vessel or unexpected calcification. Skull: Normal. Negative for fracture or focal lesion. Other: None. CT MAXILLOFACIAL FINDINGS Osseous: Increased comminution of minimally depressed bilateral nasal bone fractures compared to 06/17/2019 with slight increase in the degree of depression of the left nasal bone fracture. Unchanged appearance of minimally  displaced fracture of the superior nasal septum compared to prior. Orbital walls intact. Mandible intact without fracture or dislocation. Orbits: Negative. No traumatic or inflammatory finding. Sinuses: Clear. Soft tissues: Mild soft tissue swelling over the nasal area. CT CERVICAL SPINE FINDINGS Alignment: Minimal grade 1 anterolisthesis C3 on C4 is unchanged from prior. Facet alignment is maintained. Skull base and vertebrae: No acute fracture. No primary bone lesion or focal pathologic process. Soft tissues and spinal canal: No prevertebral fluid or swelling. No visible canal hematoma. Disc levels: Intervertebral disc spaces are maintained. There is focal advanced facet arthrosis at C3-4 on the left. Upper chest: Negative. Other: None. IMPRESSION: 1. No acute intracranial findings. 2. Increased comminution and slight increased depression of bilateral nasal bone fractures compared to prior CT 06/17/2019 suggesting acute on subacute fracture. 3. Unchanged appearance of  minimally displaced superior nasal septum fracture. 4. No acute fracture or dislocation of the cervical spine. 5. Focal left-sided facet arthropathy at C3-4. Electronically Signed   By: Davina Poke M.D.   On: 06/27/2019 11:58   Dg Chest Portable 1 View  Result Date: 06/27/2019 CLINICAL DATA:  Chest pain EXAM: PORTABLE CHEST 1 VIEW COMPARISON:  None. FINDINGS: The heart size and mediastinal contours are within normal limits. Low lung volumes. No focal airspace consolidation, pleural effusion, or pneumothorax. The visualized skeletal structures are unremarkable. IMPRESSION: Low lung volumes.  No acute cardiopulmonary findings. Electronically Signed   By: Davina Poke M.D.   On: 06/27/2019 12:28   Dg Knee Left Port  Result Date: 06/27/2019 CLINICAL DATA:  Bilateral knee pain EXAM: PORTABLE LEFT KNEE - 1-2 VIEW; PORTABLE RIGHT KNEE - 1-2 VIEW COMPARISON:  None. FINDINGS: No evidence of fracture, dislocation, or joint effusion. No  evidence of arthropathy or other focal bone abnormality. Soft tissues are unremarkable. Partially visualized hardware within the bilateral femoral diaphyses. IMPRESSION: No acute osseous abnormality of the bilateral knees. Electronically Signed   By: Davina Poke M.D.   On: 06/27/2019 12:28   Dg Knee Right Port  Result Date: 06/27/2019 CLINICAL DATA:  Bilateral knee pain EXAM: PORTABLE LEFT KNEE - 1-2 VIEW; PORTABLE RIGHT KNEE - 1-2 VIEW COMPARISON:  None. FINDINGS: No evidence of fracture, dislocation, or joint effusion. No evidence of arthropathy or other focal bone abnormality. Soft tissues are unremarkable. Partially visualized hardware within the bilateral femoral diaphyses. IMPRESSION: No acute osseous abnormality of the bilateral knees. Electronically Signed   By: Davina Poke M.D.   On: 06/27/2019 12:28   Dg Hip Unilat With Pelvis 2-3 Views Left  Result Date: 06/27/2019 CLINICAL DATA:  48 year old female with fall and left hip pain. EXAM: DG HIP (WITH OR WITHOUT PELVIS) 2-3V LEFT COMPARISON:  Pelvic radiograph dated 12/07/2010 FINDINGS: There is a total right hip arthroplasty. Partially visualized left femoral intramedullary rod and transcervical screw. The visualized portions of the hardware appear intact. There is no acute fracture or dislocation. Mild arthritic changes of the left hip. Old healed left trochanteric fracture and bridging callus formation. The soft tissues are unremarkable. IMPRESSION: No acute fracture or dislocation. Electronically Signed   By: Anner Crete M.D.   On: 06/27/2019 12:26   Ct Maxillofacial Wo Contrast  Result Date: 06/27/2019 CLINICAL DATA:  Headache, neck pain, facial ecchymosis EXAM: CT HEAD WITHOUT CONTRAST CT MAXILLOFACIAL WITHOUT CONTRAST CT CERVICAL SPINE WITHOUT CONTRAST TECHNIQUE: Multidetector CT imaging of the head, cervical spine, and maxillofacial structures were performed using the standard protocol without intravenous contrast.  Multiplanar CT image reconstructions of the cervical spine and maxillofacial structures were also generated. COMPARISON:  06/17/2019 FINDINGS: CT HEAD FINDINGS Brain: No evidence of acute infarction, hemorrhage, hydrocephalus, extra-axial collection or mass lesion/mass effect. Vascular: No hyperdense vessel or unexpected calcification. Skull: Normal. Negative for fracture or focal lesion. Other: None. CT MAXILLOFACIAL FINDINGS Osseous: Increased comminution of minimally depressed bilateral nasal bone fractures compared to 06/17/2019 with slight increase in the degree of depression of the left nasal bone fracture. Unchanged appearance of minimally displaced fracture of the superior nasal septum compared to prior. Orbital walls intact. Mandible intact without fracture or dislocation. Orbits: Negative. No traumatic or inflammatory finding. Sinuses: Clear. Soft tissues: Mild soft tissue swelling over the nasal area. CT CERVICAL SPINE FINDINGS Alignment: Minimal grade 1 anterolisthesis C3 on C4 is unchanged from prior. Facet alignment is maintained. Skull base and vertebrae: No acute  fracture. No primary bone lesion or focal pathologic process. Soft tissues and spinal canal: No prevertebral fluid or swelling. No visible canal hematoma. Disc levels: Intervertebral disc spaces are maintained. There is focal advanced facet arthrosis at C3-4 on the left. Upper chest: Negative. Other: None. IMPRESSION: 1. No acute intracranial findings. 2. Increased comminution and slight increased depression of bilateral nasal bone fractures compared to prior CT 06/17/2019 suggesting acute on subacute fracture. 3. Unchanged appearance of minimally displaced superior nasal septum fracture. 4. No acute fracture or dislocation of the cervical spine. 5. Focal left-sided facet arthropathy at C3-4. Electronically Signed   By: Duanne GuessNicholas  Plundo M.D.   On: 06/27/2019 11:58    Procedures Procedures (including critical care time)  Medications  Ordered in ED Medications - No data to display   Initial Impression / Assessment and Plan / ED Course  I have reviewed the triage vital signs and the nursing notes.  Pertinent labs & imaging results that were available during my care of the patient were reviewed by me and considered in my medical decision making (see chart for details).       48 year old presents to ER after mechanical fall to face today.  Here well-appearing, trauma work-up negative.  Facility also reported that she had a seizure-like episode on Sunday, patient has known history of seizure disorder, her valproic acid level was within therapeutic range today, she has not had any subsequent seizure activity.  Believe she is appropriate for outpatient management this time, recommended close recheck with both primary doctor as well as her primary neurologist.    After the discussed management above, the patient was determined to be safe for discharge.  The patient was in agreement with this plan and all questions regarding their care were answered.  ED return precautions were discussed and the patient will return to the ED with any significant worsening of condition.    Final Clinical Impressions(s) / ED Diagnoses   Final diagnoses:  Fall, initial encounter  Seizure disorder [facility](HCC)  Closed fracture of nasal bone with routine healing, subsequent encounter    ED Discharge Orders    None       Milagros Lollykstra, Ed Rayson S, MD 06/27/19 [phone]

## 2019-06-27 NOTE — ED Triage Notes (Signed)
Per EMS: Pt has been falling since August, pt fell again today.  Fall was witnessed.  Pt fell on nose and right knee.  Pt hx of seizures, no seizure today. BP 114/72 Pulse 68 RR 18 99% RA CBG 123 98.3 F temp

## 2019-06-27 NOTE — ED Notes (Signed)
Patient transported to CT 

## 2019-06-27 NOTE — Progress Notes (Signed)
PATIENT: Katherine Wolf DOB: August 26, 1971  REASON FOR VISIT: follow up HISTORY FROM: patient  Chief Complaint  Patient presents with   Follow-up    ? fall realted to sz.  Seen in ED 3 x in September 2020   Seizures/ falls     HISTORY OF PRESENT ILLNESS: Today 06/29/19 Katherine CooleyKaren Wolf is a 48 y.o. female with CP here today for follow up of seizure and multiple falls. She is a resident at [facility]. Caregiver with her today has limited information but aids in history.  Caregiver reports that she has had approx 7 falls in the past month. She has also had multiple witnessed seizures, some requiring ER eval (11/2018 and 06/2019). With each evaluation divalproex was found to be subtherapeutic. On 9/5 divalproex was increased to 500mg  twice daily.  ER evaluation yesterday shows valproic acid level of 76. Caregiver thinks that she may have had "a mild seizure" about 4 days ago but uncertain of details. She was told that Katherine BraunKaren was standing at the sink in the kitchen and staff heard a loud noise. When they saw her she had fallen to the floor. When they got to her they noticed her eyes were rolling. On call provider called and decision was made to observe her. No seizure activity noted since.   She was started on divalproex 500mg  BID in 04/2018 by PCP. At follow up, 2 weeks later, dose was decreased to 250mg  BID due to concerns of "movement disorder and lethargy."  She is in a room with 5 other patients. She usually walks without assistance. She has not required assistive devices. She needs very minimal assistance with bathing and dressing. Recently, they have started using gait belt and walker or wheelchair for long distances. BP is not routinely checked. Today readings are 85/63 (at beginning of visit) and 108/60 (with standing). Patient does report feeling dizzy with standing, however, caregiver states that she will repeat words said to her. She is taking metoprolol 25mg  twice daily prescribed by PCP.     HISTORY: (copied from Dr Richrd HumblesPenumalli's note on 10/31/2018)  48 year old female with developmental delay, here for evaluation of seizure disorder.  Patient arrives with transporter.  Patient and transporter do not have any additional clinical information.  I reviewed referring records in chart.  Last seizure June 2018.  Patient is stable on Vimpat and Depakote.  No triggering or aggravating factors.   REVIEW OF SYSTEMS: Out of a complete 14 system review of symptoms, the patient complains only of the following symptoms, not able to obtain and all other reviewed systems are negative.  ALLERGIES: Allergies  Allergen Reactions   Penicillins     On Mar- unsure of other information    HOME MEDICATIONS: Outpatient Medications Prior to Visit  Medication Sig Dispense Refill   acetaminophen (TYLENOL) 325 MG tablet Take 650 mg by mouth at bedtime.      Calcium Carbonate-Vitamin D 600-400 MG-UNIT tablet Take by mouth.     cholecalciferol (VITAMIN D3) 25 MCG (1000 UT) tablet Take 1,000 Units by mouth daily.     citalopram (CELEXA) 20 MG tablet Take 30 mg by mouth daily.   2   diazepam (DIASTAT ACUDIAL) 10 MG GEL Place rectally as needed for seizure.     divalproex (DEPAKOTE) 500 MG DR tablet Take 500 mg by mouth 2 (two) times daily.      docusate sodium (COLACE) 50 MG capsule Take 50 mg by mouth 2 (two) times daily.  hydrOXYzine (VISTARIL) 50 MG capsule Take 50 mg by mouth 3 (three) times daily as needed.     metoprolol tartrate (LOPRESSOR) 25 MG tablet Take 25 mg by mouth 2 (two) times daily.  2   OLANZapine (ZYPREXA) 2.5 MG tablet Take 2.5 mg by mouth at bedtime.  0   Olopatadine HCl 0.2 % SOLN Place 1 drop into both eyes every morning.      pantoprazole (PROTONIX) 20 MG tablet Take 20 mg by mouth daily.     promethazine (PHENERGAN) 25 MG suppository Place 25 mg rectally every 6 (six) hours as needed for nausea or vomiting.     promethazine (PHENERGAN) 25 MG tablet Take 25 mg by  mouth every 6 (six) hours as needed for nausea or vomiting.     VIMPAT 200 MG TABS tablet Take 200 mg by mouth 2 (two) times daily.   5   divalproex (DEPAKOTE) 250 MG DR tablet Take 250 mg by mouth every morning.   11   No facility-administered medications prior to visit.     PAST MEDICAL HISTORY: Past Medical History:  Diagnosis Date   Anxiety    Cerebral palsy ([place])    Depression    Hip fracture (HCC)    Intellectual disability    Osteoporosis    Schizophrenia ([place])    Seizures ([place])     PAST SURGICAL HISTORY: History reviewed. No pertinent surgical history.  FAMILY HISTORY: History reviewed. No pertinent family history.  SOCIAL HISTORY: Social History   Socioeconomic History   Marital status: Single    Spouse name: Not on file   Number of children: Not on file   Years of education: Not on file   Highest education level: Not on file  Occupational History   Not on file  Social Needs   Financial resource strain: Not on file   Food insecurity    Worry: Not on file    Inability: Not on file   Transportation needs    Medical: Not on file    Non-medical: Not on file  Tobacco Use   Smoking status: Never Smoker   Smokeless tobacco: Never Used  Substance and Sexual Activity   Alcohol use: Never    Frequency: Never   Drug use: Never   Sexual activity: Not on file  Lifestyle   Physical activity    Days per week: Not on file    Minutes per session: Not on file   Stress: Not on file  Relationships   Social connections    Talks on phone: Not on file    Gets together: Not on file    Attends religious service: Not on file    Active member of club or organization: Not on file    Attends meetings of clubs or organizations: Not on file    Relationship status: Not on file   Intimate partner violence    Fear of current or ex partner: Not on file    Emotionally abused: Not on file    Physically abused: Not on file    Forced sexual activity:  Not on file  Other Topics Concern   Not on file  Social History Narrative   Not on file      PHYSICAL EXAM  Vitals:   06/28/19 1122  BP: (!) 85/63  Pulse: 81  Temp: 97.7 F (36.5 C)  Weight: 166 lb 9.6 oz (75.6 kg)  Height: 5\' 9"  (1.753 m)   Body mass index is 24.6 kg/m.  Generalized:  Well developed, in no acute distress  Cardiology: normal rate and rhythm, no murmur noted Neurological examination  Mentation: Alert, not oriented to time, place, history taking. Follows all commands speech and language fluent Cranial nerve II-XII: Pupils were equal round reactive to light. Extraocular movements were full, visual field were full on confrontational test.  Motor: The motor testing reveals 5 over 5 strength of all 4 extremities. Good symmetric motor tone is noted throughout.  Sensory: Sensory testing is intact to soft touch on all 4 extremities. No evidence of extinction is noted.  Coordination: patient unable to complete testing Gait and station: gait not assessed as patient reported feeling dizzy and asked to sit down  DIAGNOSTIC DATA (LABS, IMAGING, TESTING) - I reviewed patient records, labs, notes, testing and imaging myself where available.  No flowsheet data found.   Lab Results  Component Value Date   WBC 6.1 06/27/2019   HGB 12.2 06/27/2019   HCT 36.3 06/27/2019   MCV 92.6 06/27/2019   PLT 226 06/27/2019      Component Value Date/Time   NA 139 06/27/2019 1208   K 4.1 06/27/2019 1208   CL 106 06/27/2019 1208   CO2 23 06/27/2019 1208   GLUCOSE 90 06/27/2019 1208   BUN 14 06/27/2019 1208   CREATININE 0.57 06/27/2019 1208   CALCIUM 9.0 06/27/2019 1208   PROT 8.2 (H) 12/07/2018 2125   ALBUMIN 4.4 12/07/2018 2125   AST 20 12/07/2018 2125   ALT 10 12/07/2018 2125   ALKPHOS 73 12/07/2018 2125   BILITOT 0.5 12/07/2018 2125   GFRNONAA >60 06/27/2019 1208   GFRAA >60 06/27/2019 1208   No results found for: CHOL, HDL, LDLCALC, LDLDIRECT, TRIG, CHOLHDL No  results found for: HGBA1C No results found for: VITAMINB12 No results found for: TSH     ASSESSMENT AND PLAN 48 y.o. year old female  has a past medical history of Anxiety, Cerebral palsy (HCC), Depression, Hip fracture (HCC), Intellectual disability, Osteoporosis, Schizophrenia (HCC), and Seizures (HCC). here with     ICD-10-CM   1. Seizure disorder (HCC)  G40.909   2. Other cerebral palsy (HCC)  G80.8   3. Multiple falls  R29.6   4. Hypotension, unspecified hypotension type  I95.9     Fumiye has suffered multiple seizures and falls since last being seen.  Fortunately Depakote was increased to 500 mg twice daily about 10 days ago.  She has tolerated the increased dose.  It is uncertain whether event that occurred 4 days ago was related to seizure.  Facility will continue to monitor patient closely.  They have ordered physical therapy with assessment upcoming.  I have also asked primary care to take a look at her blood pressures.  Blood pressure is low in the office today.  I am concerned that metoprolol dose may need to be adjusted.  Dr. Rolland Porter notified.  Metoprolol dose decreased to 25 mg once daily.  We will follow-up with patient in 3 months, sooner if needed.  Caregiver verbalizes understanding and orders written to [facility].   No orders of the defined types were placed in this encounter.    No orders of the defined types were placed in this encounter.     I spent 45 minutes with the patient. 50% of this time was spent counseling and educating patient on plan of care and medications.    Shawnie Dapper, FNP-C 06/29/2019, 4:15 PM [facility] [address], [address] [place], [place] [address] [phone]

## 2019-06-28 ENCOUNTER — Encounter: Payer: Self-pay | Admitting: Family Medicine

## 2019-06-28 ENCOUNTER — Other Ambulatory Visit: Payer: Self-pay

## 2019-06-28 ENCOUNTER — Ambulatory Visit (INDEPENDENT_AMBULATORY_CARE_PROVIDER_SITE_OTHER): Payer: Medicare Other | Admitting: Family Medicine

## 2019-06-28 VITALS — BP 85/63 | HR 81 | Temp 97.7°F | Ht 69.0 in | Wt 166.6 lb

## 2019-06-28 DIAGNOSIS — G808 Other cerebral palsy: Secondary | ICD-10-CM

## 2019-06-28 DIAGNOSIS — R296 Repeated falls: Secondary | ICD-10-CM | POA: Diagnosis not present

## 2019-06-28 DIAGNOSIS — G40909 Epilepsy, unspecified, not intractable, without status epilepticus: Secondary | ICD-10-CM | POA: Diagnosis not present

## 2019-06-28 DIAGNOSIS — I959 Hypotension, unspecified: Secondary | ICD-10-CM | POA: Diagnosis not present

## 2019-06-28 NOTE — Patient Instructions (Addendum)
Continue Depakote 500mg  twice daily as well as Vimpat 200mg  twice daily  Proceed with PT ordered by [facility], Please send evaluation report to us once completed  Please follow up with PCP asap for evaluation of low BP (85/63 in office today with pulse 61). Consider decreasing or stopping metoprolol.   Fall Prevention in the Home, Adult Falls can cause injuries. They can happen to people of all ages. There are many things you can do to make your home safe and to help prevent falls. Ask for help when making these changes, if needed. What actions can I take to prevent falls? General Instructions  Use good lighting in all rooms. Replace any light bulbs that burn out.  Turn on the lights when you go into a dark area. Use night-lights.  Keep items that you use often in easy-to-reach places. Lower the shelves around your home if necessary.  Set up your furniture so you have a clear path. Avoid moving your furniture around.  Do not have throw rugs and other things on the floor that can make you trip.  Avoid walking on wet floors.  If any of your floors are uneven, fix them.  Add color or contrast paint or tape to clearly mark and help you see: ? Any grab bars or handrails. ? First and last steps of stairways. ? Where the edge of each step is.  If you use a stepladder: ? Make sure that it is fully opened. Do not climb a closed stepladder. ? Make sure that both sides of the stepladder are locked into place. ? Ask someone to hold the stepladder for you while you use it.  If there are any pets around you, be aware of where they are. What can I do in the bathroom?      Keep the floor dry. Clean up any water that spills onto the floor as soon as it happens.  Remove soap buildup in the tub or shower regularly.  Use non-skid mats or decals on the floor of the tub or shower.  Attach bath mats securely with double-sided, non-slip rug tape.  If you need to sit down in the shower, use a  plastic, non-slip stool.  Install grab bars by the toilet and in the tub and shower. Do not use towel bars as grab bars. What can I do in the bedroom?  Make sure that you have a light by your bed that is easy to reach.  Do not use any sheets or blankets that are too big for your bed. They should not hang down onto the floor.  Have a firm chair that has side arms. You can use this for support while you get dressed. What can I do in the kitchen?  Clean up any spills right away.  If you need to reach something above you, use a strong step stool that has a grab bar.  Keep electrical cords out of the way.  Do not use floor polish or wax that makes floors slippery. If you must use wax, use non-skid floor wax. What can I do with my stairs?  Do not leave any items on the stairs.  Make sure that you have a light switch at the top of the stairs and the bottom of the stairs. If you do not have them, ask someone to add them for you.  Make sure that there are handrails on both sides of the stairs, and use them. Fix handrails that are broken or loose. Make  sure that handrails are as long as the stairways.  Install non-slip stair treads on all stairs in your home.  Avoid having throw rugs at the top or bottom of the stairs. If you do have throw rugs, attach them to the floor with carpet tape.  Choose a carpet that does not hide the edge of the steps on the stairway.  Check any carpeting to make sure that it is firmly attached to the stairs. Fix any carpet that is loose or worn. What can I do on the outside of my home?  Use bright outdoor lighting.  Regularly fix the edges of walkways and driveways and fix any cracks.  Remove anything that might make you trip as you walk through a door, such as a raised step or threshold.  Trim any bushes or trees on the path to your home.  Regularly check to see if handrails are loose or broken. Make sure that both sides of any steps have handrails.   Install guardrails along the edges of any raised decks and porches.  Clear walking paths of anything that might make someone trip, such as tools or rocks.  Have any leaves, snow, or ice cleared regularly.  Use sand or salt on walking paths during winter.  Clean up any spills in your garage right away. This includes grease or oil spills. What other actions can I take?  Wear shoes that: ? Have a low heel. Do not wear high heels. ? Have rubber bottoms. ? Are comfortable and fit you well. ? Are closed at the toe. Do not wear open-toe sandals.  Use tools that help you move around (mobility aids) if they are needed. These include: ? Canes. ? Walkers. ? Scooters. ? Crutches.  Review your medicines with your doctor. Some medicines can make you feel dizzy. This can increase your chance of falling. Ask your doctor what other things you can do to help prevent falls. Where to find more information  Centers for Disease Control and Prevention, STEADI: HealthcareCounselor.com.pthttps://cdc.gov  [employer] on Aging: RingConnections.sihttps://go4life.nia.nih.gov Contact a doctor if:  You are afraid of falling at home.  You feel weak, drowsy, or dizzy at home.  You fall at home. Summary  There are many simple things that you can do to make your home safe and to help prevent falls.  Ways to make your home safe include removing tripping hazards and installing grab bars in the bathroom.  Ask for help when making these changes in your home. This information is not intended to replace advice given to you by your health care provider. Make sure you discuss any questions you have with your health care provider. Document Released: 07/25/2009 Document Revised: 01/19/2019 Document Reviewed: 05/13/2017 Elsevier Patient Education  2020 [employer]   Seizure, Adult A seizure is a sudden burst of abnormal electrical activity in the brain. Seizures usually last from 30 seconds to 2 minutes. They can cause many different symptoms.  Usually, seizures are not harmful unless they last a long time. What are the causes? Common causes of this condition include:  Fever or infection.  Conditions that affect the brain, such as: ? A brain abnormality that you were born with. ? A brain or head injury. ? Bleeding in the brain. ? A tumor. ? Stroke. ? Brain disorders such as autism or cerebral palsy.  Low blood sugar.  Conditions that are passed from parent to child (are inherited).  Problems with substances, such as: ? Having a reaction to a  drug or a medicine. ? Suddenly stopping the use of a substance (withdrawal). In some cases, the cause may not be known. A person who has repeated seizures over time without a clear cause has a condition called epilepsy. What increases the risk? You are more likely to get this condition if you have:  A family history of epilepsy.  Had a seizure in the past.  A brain disorder.  A history of head injury, lack of oxygen at birth, or strokes. What are the signs or symptoms? There are many types of seizures. The symptoms vary depending on the type of seizure you have. Examples of symptoms during a seizure include:  Shaking (convulsions).  Stiffness in the body.  Passing out (losing consciousness).  Head nodding.  Staring.  Not responding to sound or touch.  Loss of bladder control and bowel control. Some people have symptoms right before and right after a seizure happens. Symptoms before a seizure may include:  Fear.  Worry (anxiety).  Feeling like you may vomit (nauseous).  Feeling like the room is spinning (vertigo).  Feeling like you saw or heard something before (dj vu).  Odd tastes or smells.  Changes in how you see. You may see flashing lights or spots. Symptoms after a seizure happens can include:  Confusion.  Sleepiness.  Headache.  Weakness on one side of the body. How is this treated? Most seizures will stop on their own in under 5  minutes. In these cases, no treatment is needed. Seizures that last longer than 5 minutes will usually need treatment. Treatment can include:  Medicines given through an IV tube.  Avoiding things that are known to cause your seizures. These can include medicines that you take for another condition.  Medicines to treat epilepsy.  Surgery to stop the seizures. This may be needed if medicines do not help. Follow these instructions at home: Medicines  Take over-the-counter and prescription medicines only as told by your doctor.  Do not eat or drink anything that may keep your medicine from working, such as alcohol. Activity  Do not do any activities that would be dangerous if you had another seizure, like driving or swimming. Wait until your doctor says it is safe for you to do them.  If you live in the [place] ask your local DMV (department of motor vehicles) when you can drive.  Get plenty of rest. Teaching others Teach friends and family what to do when you have a seizure. They should:  Lay you on the ground.  Protect your head and body.  Loosen any tight clothing around your neck.  Turn you on your side.  Not hold you down.  Not put anything into your mouth.  Know whether or not you need emergency care.  Stay with you until you are better.  General instructions  Contact your doctor each time you have a seizure.  Avoid anything that gives you seizures.  Keep a seizure diary. Write down: ? What you think caused each seizure. ? What you remember about each seizure.  Keep all follow-up visits as told by your doctor. This is important. Contact a doctor if:  You have another seizure.  You have seizures more often.  There is any change in what happens during your seizures.  You keep having seizures with treatment.  You have symptoms of being sick or having an infection. Get help right away if:  You have a seizure that: ? Lasts longer than 5 minutes. ? Is  different than seizures you had before. ? Makes it harder to breathe. ? Happens after you hurt your head.  You have any of these symptoms after a seizure: ? Not being able to speak. ? Not being able to use a part of your body. ? Confusion. ? A bad headache.  You have two or more seizures in a row.  You do not wake up right after a seizure.  You get hurt during a seizure. These symptoms may be an emergency. Do not wait to see if the symptoms will go away. Get medical help right away. Call your local emergency services (911 in the [place] Do not drive yourself to the hospital. Summary  Seizures usually last from 30 seconds to 2 minutes. Usually, they are not harmful unless they last a long time.  Do not eat or drink anything that may keep your medicine from working, such as alcohol.  Teach friends and family what to do when you have a seizure.  Contact your doctor each time you have a seizure. This information is not intended to replace advice given to you by your health care provider. Make sure you discuss any questions you have with your health care provider. Document Released: 03/16/2008 Document Revised: 12/16/2018 Document Reviewed: 12/16/2018 Elsevier Patient Education  [place].

## 2019-06-29 ENCOUNTER — Encounter: Payer: Self-pay | Admitting: Family Medicine

## 2019-07-30 NOTE — Progress Notes (Signed)
PATIENT: Katherine Wolf DOB: 01/27/1971  REASON FOR VISIT: follow up HISTORY FROM: patient  Chief Complaint  Patient presents with   Follow-up    Rm 8, caregiver,     Seizures    f/u from 06-27-19 ED visit slight sz, and fall.     Fall     HISTORY OF PRESENT ILLNESS: Today 07/31/19 Katherine Wolf is a 48 y.o. female here today for follow up for seizure and recurrent falls.  She continues divalproex 500 mg twice daily as well as Vimpat 200 mg twice daily. She was last seen about 4 weeks ago and concerns were present for orthostatic blood pressures. She was advised to see PCP to review need for metoprolol 25mg  BID.  No medications changes were made.  Patient presents with her caregiver today who states that she is doing much better.  No seizure activity or fall since last being seen 9/16.  She is tolerating medications well without obvious adverse effects.  She is being assessed by physical therapy at [facility].  She reports feeling well today and without concerns.  HISTORY: (copied from my note on 06/28/2019)  Katherine Wolf is a 48 y.o. female with CP here today for follow up of seizure and multiple falls. She is a resident at 52. Caregiver with her today has limited information but aids in history.  Caregiver reports that she has had approx 7 falls in the past month. She has also had multiple witnessed seizures, some requiring ER eval (11/2018 and 06/2019). With each evaluation divalproex was found to be subtherapeutic. On 9/5 divalproex was increased to 500mg  twice daily.  ER evaluation yesterday shows valproic acid level of 76. Caregiver thinks that she may have had "a mild seizure" about 4 days ago but uncertain of details. She was told that Katherine Wolf was standing at the sink in the kitchen and staff heard a loud noise. When they saw her she had fallen to the floor. When they got to her they noticed her eyes were rolling. On call provider called and decision was made to  observe her. No seizure activity noted since.   She was started on divalproex 500mg  BID in 04/2018 by PCP. At follow up, 2 weeks later, dose was decreased to 250mg  BID due to concerns of "movement disorder and lethargy."  She is in a room with 5 other patients. She usually walks without assistance. She has not required assistive devices. She needs very minimal assistance with bathing and dressing. Recently, they have started using gait belt and walker or wheelchair for long distances. BP is not routinely checked. Today readings are 85/63 (at beginning of visit) and 108/60 (with standing). Patient does report feeling dizzy with standing, however, caregiver states that she will repeat words said to her. She is taking metoprolol 25mg  twice daily prescribed by PCP.   HISTORY: (copied from Dr Clydie Braun note on 10/31/2018)  48 year old female with developmental delay, here for evaluation of seizure disorder. Patient arrives with transporter. Patient and transporter do not have any additional clinical information. I reviewed referring records in chart. Last seizure June 2018. Patient is stable on Vimpat and Depakote. No triggering or aggravating factors.   REVIEW OF SYSTEMS: Out of a complete 14 system review of symptoms, the patient complains only of the following symptoms, and all other reviewed systems are negative.  ALLERGIES: Allergies  Allergen Reactions   Penicillins     On Mar- unsure of other information    HOME MEDICATIONS:  Outpatient Medications Prior to Visit  Medication Sig Dispense Refill   acetaminophen (TYLENOL) 325 MG tablet Take 650 mg by mouth at bedtime.      Calcium Carbonate-Vitamin D 600-400 MG-UNIT tablet Take by mouth.     cholecalciferol (VITAMIN D3) 25 MCG (1000 UT) tablet Take 1,000 Units by mouth daily.     citalopram (CELEXA) 20 MG tablet Take 30 mg by mouth daily.   2   diazepam (DIASTAT ACUDIAL) 10 MG GEL Place rectally as needed for seizure.      divalproex (DEPAKOTE) 500 MG DR tablet Take 500 mg by mouth 2 (two) times daily.      docusate sodium (COLACE) 50 MG capsule Take 100 mg by mouth daily.      hydrOXYzine (VISTARIL) 50 MG capsule Take 50 mg by mouth 3 (three) times daily as needed.     metoprolol tartrate (LOPRESSOR) 25 MG tablet Take 25 mg by mouth 2 (two) times daily.  2   OLANZapine (ZYPREXA) 2.5 MG tablet Take 2.5 mg by mouth at bedtime.  0   Olopatadine HCl 0.2 % SOLN Place 1 drop into both eyes every morning.      pantoprazole (PROTONIX) 20 MG tablet Take 20 mg by mouth daily.     promethazine (PHENERGAN) 25 MG suppository Place 25 mg rectally every 6 (six) hours as needed for nausea or vomiting.     promethazine (PHENERGAN) 25 MG tablet Take 25 mg by mouth every 6 (six) hours as needed for nausea or vomiting.     VIMPAT 200 MG TABS tablet Take 200 mg by mouth 2 (two) times daily.   5   No facility-administered medications prior to visit.     PAST MEDICAL HISTORY: Past Medical History:  Diagnosis Date   Anxiety    Cerebral palsy ([place])    Depression    Hip fracture (HCC)    Intellectual disability    Osteoporosis    Schizophrenia ([place])    Seizures ([place])     PAST SURGICAL HISTORY: History reviewed. No pertinent surgical history.  FAMILY HISTORY: History reviewed. No pertinent family history.  SOCIAL HISTORY: Social History   Socioeconomic History   Marital status: Single    Spouse name: Not on file   Number of children: Not on file   Years of education: Not on file   Highest education level: Not on file  Occupational History   Not on file  Social Needs   Financial resource strain: Not on file   Food insecurity    Worry: Not on file    Inability: Not on file   Transportation needs    Medical: Not on file    Non-medical: Not on file  Tobacco Use   Smoking status: Never Smoker   Smokeless tobacco: Never Used  Substance and Sexual Activity   Alcohol use: Never     Frequency: Never   Drug use: Never   Sexual activity: Not on file  Lifestyle   Physical activity    Days per week: Not on file    Minutes per session: Not on file   Stress: Not on file  Relationships   Social connections    Talks on phone: Not on file    Gets together: Not on file    Attends religious service: Not on file    Active member of club or organization: Not on file    Attends meetings of clubs or organizations: Not on file    Relationship status: Not on  file   Intimate partner violence    Fear of current or ex partner: Not on file    Emotionally abused: Not on file    Physically abused: Not on file    Forced sexual activity: Not on file  Other Topics Concern   Not on file  Social History Narrative   Not on file      PHYSICAL EXAM  Vitals:   07/31/19 1308  BP: 94/68  Pulse: 91  Temp: (!) 97.1 F (36.2 C)  Height: 5\' 9"  (1.753 m)   Body mass index is 24.6 kg/m.  Generalized: Well developed, in no acute distress  Cardiology: normal rate and rhythm, no murmur noted Neurological examination  Mentation: Alert oriented to place, some history taking. Follows intermittent commands speech and language fluent Cranial nerve II-XII: Pupils were equal round reactive to light. Extraocular movements were full, visual field were full on confrontational test. Not able to fully assess to to patient not following commands  Motor: The motor testing reveals 5 over 5 strength of all 4 extremities. Good symmetric motor tone is noted throughout.  Sensory: Sensory testing is intact to soft touch on all 4 extremities. No evidence of extinction is noted.  Coordination: unable to complete testing due to inattention Gait and station: Gait is mildly spastic  DIAGNOSTIC DATA (LABS, IMAGING, TESTING) - I reviewed patient records, labs, notes, testing and imaging myself where available.  No flowsheet data found.   Lab Results  Component Value Date   WBC 6.1 06/27/2019    HGB 12.2 06/27/2019   HCT 36.3 06/27/2019   MCV 92.6 06/27/2019   PLT 226 06/27/2019      Component Value Date/Time   NA 139 06/27/2019 1208   K 4.1 06/27/2019 1208   CL 106 06/27/2019 1208   CO2 23 06/27/2019 1208   GLUCOSE 90 06/27/2019 1208   BUN 14 06/27/2019 1208   CREATININE 0.57 06/27/2019 1208   CALCIUM 9.0 06/27/2019 1208   PROT 8.2 (H) 12/07/2018 2125   ALBUMIN 4.4 12/07/2018 2125   AST 20 12/07/2018 2125   ALT 10 12/07/2018 2125   ALKPHOS 73 12/07/2018 2125   BILITOT 0.5 12/07/2018 2125   GFRNONAA >60 06/27/2019 1208   GFRAA >60 06/27/2019 1208   No results found for: CHOL, HDL, LDLCALC, LDLDIRECT, TRIG, CHOLHDL No results found for: ZOXW9UHGBA1C No results found for: VITAMINB12 No results found for: TSH     ASSESSMENT AND PLAN 48 y.o. year old female  has a past medical history of Anxiety, Cerebral palsy (HCC), Depression, Hip fracture (HCC), Intellectual disability, Osteoporosis, Schizophrenia (HCC), and Seizures (HCC). here with     ICD-10-CM   1. Seizure disorder (HCC)  G40.909   2. Other cerebral palsy (HCC)  G80.8   3. Developmental delay  R62.50   4. Multiple falls  R29.6     Clydie BraunKaren is doing much better today.  There have been no falls and no seizure activity noted.  We will continue divalproex 500 mg twice daily as well as Vimpat 200 mg twice daily.  She will continue to work closely with primary care for concerns of blood pressures.  Fall precautions given.  She is working with PT at her facility for therapy.  She has scheduled follow-up with Dr. Marjory LiesPenumalli in January.  She and her caregiver verbalized understanding with this plan.   No orders of the defined types were placed in this encounter.    No orders of the defined types were placed in  this encounter.     Shawnie Dapper, FNP-C 07/31/2019, 1:15 PM [facility] [address] [place], [place] [address] [phone]

## 2019-07-31 ENCOUNTER — Other Ambulatory Visit: Payer: Self-pay

## 2019-07-31 ENCOUNTER — Encounter: Payer: Self-pay | Admitting: Family Medicine

## 2019-07-31 ENCOUNTER — Telehealth: Payer: Self-pay

## 2019-07-31 ENCOUNTER — Ambulatory Visit (INDEPENDENT_AMBULATORY_CARE_PROVIDER_SITE_OTHER): Payer: Medicare Other | Admitting: Family Medicine

## 2019-07-31 VITALS — BP 94/68 | HR 91 | Temp 97.1°F | Ht 69.0 in

## 2019-07-31 DIAGNOSIS — R296 Repeated falls: Secondary | ICD-10-CM

## 2019-07-31 DIAGNOSIS — R6250 Unspecified lack of expected normal physiological development in childhood: Secondary | ICD-10-CM

## 2019-07-31 DIAGNOSIS — G40909 Epilepsy, unspecified, not intractable, without status epilepticus: Secondary | ICD-10-CM

## 2019-07-31 DIAGNOSIS — G808 Other cerebral palsy: Secondary | ICD-10-CM

## 2019-07-31 NOTE — Telephone Encounter (Signed)
Called pt to see if she f/u with her pcp regarding her falls/seizures and b/p. Called 2 x No answer.

## 2019-07-31 NOTE — Patient Instructions (Signed)
Continue divalproex  and Vimpat  twice daily   Continue with PT assessment and treatment as directed.  Follow up with PCP for any BP concerns   Fall Prevention in the Home, Adult Falls can cause injuries. They can happen to people of all ages. There are many things you can do to make your home safe and to help prevent falls. Ask for help when making these changes, if needed. What actions can I take to prevent falls? General Instructions  Use good lighting in all rooms. Replace any light bulbs that burn out.  Turn on the lights when you go into a dark area. Use night-lights.  Keep items that you use often in easy-to-reach places. Lower the shelves around your home if necessary.  Set up your furniture so you have a clear path. Avoid moving your furniture around.  Do not have throw rugs and other things on the floor that can make you trip.  Avoid walking on wet floors.  If any of your floors are uneven, fix them.  Add color or contrast paint or tape to clearly mark and help you see: ? Any grab bars or handrails. ? First and last steps of stairways. ? Where the edge of each step is.  If you use a stepladder: ? Make sure that it is fully opened. Do not climb a closed stepladder. ? Make sure that both sides of the stepladder are locked into place. ? Ask someone to hold the stepladder for you while you use it.  If there are any pets around you, be aware of where they are. What can I do in the bathroom?      Keep the floor dry. Clean up any water that spills onto the floor as soon as it happens.  Remove soap buildup in the tub or shower regularly.  Use non-skid mats or decals on the floor of the tub or shower.  Attach bath mats securely with double-sided, non-slip rug tape.  If you need to sit down in the shower, use a plastic, non-slip stool.  Install grab bars by the toilet and in the tub and shower. Do not use towel bars as grab bars. What can I do in the  bedroom?  Make sure that you have a light by your bed that is easy to reach.  Do not use any sheets or blankets that are too big for your bed. They should not hang down onto the floor.  Have a firm chair that has side arms. You can use this for support while you get dressed. What can I do in the kitchen?  Clean up any spills right away.  If you need to reach something above you, use a strong step stool that has a grab bar.  Keep electrical cords out of the way.  Do not use floor polish or wax that makes floors slippery. If you must use wax, use non-skid floor wax. What can I do with my stairs?  Do not leave any items on the stairs.  Make sure that you have a light switch at the top of the stairs and the bottom of the stairs. If you do not have them, ask someone to add them for you.  Make sure that there are handrails on both sides of the stairs, and use them. Fix handrails that are broken or loose. Make sure that handrails are as long as the stairways.  Install non-slip stair treads on all stairs in your home.  Avoid having throw  rugs at the top or bottom of the stairs. If you do have throw rugs, attach them to the floor with carpet tape.  Choose a carpet that does not hide the edge of the steps on the stairway.  Check any carpeting to make sure that it is firmly attached to the stairs. Fix any carpet that is loose or worn. What can I do on the outside of my home?  Use bright outdoor lighting.  Regularly fix the edges of walkways and driveways and fix any cracks.  Remove anything that might make you trip as you walk through a door, such as a raised step or threshold.  Trim any bushes or trees on the path to your home.  Regularly check to see if handrails are loose or broken. Make sure that both sides of any steps have handrails.  Install guardrails along the edges of any raised decks and porches.  Clear walking paths of anything that might make someone trip, such as tools  or rocks.  Have any leaves, snow, or ice cleared regularly.  Use sand or salt on walking paths during winter.  Clean up any spills in your garage right away. This includes grease or oil spills. What other actions can I take?  Wear shoes that: ? Have a low heel. Do not wear high heels. ? Have rubber bottoms. ? Are comfortable and fit you well. ? Are closed at the toe. Do not wear open-toe sandals.  Use tools that help you move around (mobility aids) if they are needed. These include: ? Canes. ? Walkers. ? Scooters. ? Crutches.  Review your medicines with your doctor. Some medicines can make you feel dizzy. This can increase your chance of falling. Ask your doctor what other things you can do to help prevent falls. Where to find more information  Centers for Disease Control and Prevention, STEADI: HealthcareCounselor.com.pt  [employer] on Aging: RingConnections.si Contact a doctor if:  You are afraid of falling at home.  You feel weak, drowsy, or dizzy at home.  You fall at home. Summary  There are many simple things that you can do to make your home safe and to help prevent falls.  Ways to make your home safe include removing tripping hazards and installing grab bars in the bathroom.  Ask for help when making these changes in your home. This information is not intended to replace advice given to you by your health care provider. Make sure you discuss any questions you have with your health care provider. Document Released: 07/25/2009 Document Revised: 01/19/2019 Document Reviewed: 05/13/2017 Elsevier Patient Education  2020 [employer]. Seizure, Adult A seizure is a sudden burst of abnormal electrical activity in the brain. Seizures usually last from 30 seconds to 2 minutes. They can cause many different symptoms. Usually, seizures are not harmful unless they last a long time. What are the causes? Common causes of this condition include:  Fever or infection.   Conditions that affect the brain, such as: ? A brain abnormality that you were born with. ? A brain or head injury. ? Bleeding in the brain. ? A tumor. ? Stroke. ? Brain disorders such as autism or cerebral palsy.  Low blood sugar.  Conditions that are passed from parent to child (are inherited).  Problems with substances, such as: ? Having a reaction to a drug or a medicine. ? Suddenly stopping the use of a substance (withdrawal). In some cases, the cause may not be known. A person who has  repeated seizures over time without a clear cause has a condition called epilepsy. What increases the risk? You are more likely to get this condition if you have:  A family history of epilepsy.  Had a seizure in the past.  A brain disorder.  A history of head injury, lack of oxygen at birth, or strokes. What are the signs or symptoms? There are many types of seizures. The symptoms vary depending on the type of seizure you have. Examples of symptoms during a seizure include:  Shaking (convulsions).  Stiffness in the body.  Passing out (losing consciousness).  Head nodding.  Staring.  Not responding to sound or touch.  Loss of bladder control and bowel control. Some people have symptoms right before and right after a seizure happens. Symptoms before a seizure may include:  Fear.  Worry (anxiety).  Feeling like you may vomit (nauseous).  Feeling like the room is spinning (vertigo).  Feeling like you saw or heard something before (dj vu).  Odd tastes or smells.  Changes in how you see. You may see flashing lights or spots. Symptoms after a seizure happens can include:  Confusion.  Sleepiness.  Headache.  Weakness on one side of the body. How is this treated? Most seizures will stop on their own in under 5 minutes. In these cases, no treatment is needed. Seizures that last longer than 5 minutes will usually need treatment. Treatment can include:  Medicines  given through an IV tube.  Avoiding things that are known to cause your seizures. These can include medicines that you take for another condition.  Medicines to treat epilepsy.  Surgery to stop the seizures. This may be needed if medicines do not help. Follow these instructions at home: Medicines  Take over-the-counter and prescription medicines only as told by your doctor.  Do not eat or drink anything that may keep your medicine from working, such as alcohol. Activity  Do not do any activities that would be dangerous if you had another seizure, like driving or swimming. Wait until your doctor says it is safe for you to do them.  If you live in the [place] ask your local DMV (department of motor vehicles) when you can drive.  Get plenty of rest. Teaching others Teach friends and family what to do when you have a seizure. They should:  Lay you on the ground.  Protect your head and body.  Loosen any tight clothing around your neck.  Turn you on your side.  Not hold you down.  Not put anything into your mouth.  Know whether or not you need emergency care.  Stay with you until you are better.  General instructions  Contact your doctor each time you have a seizure.  Avoid anything that gives you seizures.  Keep a seizure diary. Write down: ? What you think caused each seizure. ? What you remember about each seizure.  Keep all follow-up visits as told by your doctor. This is important. Contact a doctor if:  You have another seizure.  You have seizures more often.  There is any change in what happens during your seizures.  You keep having seizures with treatment.  You have symptoms of being sick or having an infection. Get help right away if:  You have a seizure that: ? Lasts longer than 5 minutes. ? Is different than seizures you had before. ? Makes it harder to breathe. ? Happens after you hurt your head.  You have any of these symptoms after  a seizure:  ? Not being able to speak. ? Not being able to use a part of your body. ? Confusion. ? A bad headache.  You have two or more seizures in a row.  You do not wake up right after a seizure.  You get hurt during a seizure. These symptoms may be an emergency. Do not wait to see if the symptoms will go away. Get medical help right away. Call your local emergency services (911 in the [place] Do not drive yourself to the hospital. Summary  Seizures usually last from 30 seconds to 2 minutes. Usually, they are not harmful unless they last a long time.  Do not eat or drink anything that may keep your medicine from working, such as alcohol.  Teach friends and family what to do when you have a seizure.  Contact your doctor each time you have a seizure. This information is not intended to replace advice given to you by your health care provider. Make sure you discuss any questions you have with your health care provider. Document Released: 03/16/2008 Document Revised: 12/16/2018 Document Reviewed: 12/16/2018 Elsevier Patient Education  [place].

## 2019-07-31 NOTE — Telephone Encounter (Signed)
The provider wanted to make sure that she f/u with her PCP to further discuss at her appointment today at 1:00.

## 2019-08-07 NOTE — Progress Notes (Signed)
I reviewed note and agree with plan.   Jayshon Dommer R. Miller [place], MD 08/07/2019, 10:26 AM Certified in Neurology, Neurophysiology and Neuroimaging  [facility] [address], [address] [place], [place] [address] [phone]  

## 2019-08-07 NOTE — Progress Notes (Signed)
I reviewed note and agree with plan.   Selvin Yun R. Wash Nienhaus, MD 08/07/2019, 11:15 AM Certified in Neurology, Neurophysiology and Neuroimaging  [facility] [address], [address] [place], [place] [address] [phone]  

## 2019-11-07 ENCOUNTER — Ambulatory Visit (INDEPENDENT_AMBULATORY_CARE_PROVIDER_SITE_OTHER): Payer: Medicare Other | Admitting: Diagnostic Neuroimaging

## 2019-11-07 ENCOUNTER — Other Ambulatory Visit: Payer: Self-pay

## 2019-11-07 ENCOUNTER — Encounter: Payer: Self-pay | Admitting: Diagnostic Neuroimaging

## 2019-11-07 VITALS — BP 109/77 | HR 88 | Temp 97.4°F | Ht 69.0 in | Wt 164.0 lb

## 2019-11-07 DIAGNOSIS — R6250 Unspecified lack of expected normal physiological development in childhood: Secondary | ICD-10-CM | POA: Diagnosis not present

## 2019-11-07 DIAGNOSIS — G40909 Epilepsy, unspecified, not intractable, without status epilepticus: Secondary | ICD-10-CM

## 2019-11-07 DIAGNOSIS — I959 Hypotension, unspecified: Secondary | ICD-10-CM | POA: Diagnosis not present

## 2019-11-07 NOTE — Progress Notes (Signed)
     History of Present Illness:  - since last visit, doing well; no seizures; no more falls; using wheelchair, gait belt, 24 hour supervision - tolerating meds; doing well - BP stable    Observations/Objective:  GENERAL EXAM/CONSTITUTIONAL: Vitals:  Vitals:   11/07/19 1023  BP: 109/77  Pulse: 88  Temp: (!) 97.4 F (36.3 C)  Weight: 164 lb (74.4 kg)  Height: 5\' 9"  (1.753 m)     Body mass index is 24.22 kg/m. Wt Readings from Last 3 Encounters:  11/07/19 164 lb (74.4 kg)  06/28/19 166 lb 9.6 oz (75.6 kg)  06/27/19 160 lb (72.6 kg)     Patient is in no distress; well developed, nourished and groomed; neck is supple  CARDIOVASCULAR:  Examination of carotid arteries is normal; no carotid bruits  Regular rate and rhythm, no murmurs  Examination of peripheral vascular system by observation and palpation is normal  EYES:  Ophthalmoscopic exam of optic discs and posterior segments is normal; no papilledema or hemorrhages  No exam data present  MUSCULOSKELETAL:  Gait, strength, tone, movements noted in Neurologic exam below  NEUROLOGIC: MENTAL STATUS:  No flowsheet data found.  DECR MEMORY   DECR attention and concentration  DECR FLUENCY; FOLLOWS SIMPLE COMMANDS  DECR fund of knowledge  PLEASANT, SMILING  CRANIAL NERVE:   2nd - no papilledema on fundoscopic exam  2nd, 3rd, 4th, 6th - pupils equal and reactive to light, visual fields full to confrontation, extraocular muscles intact, no nystagmus  5th - facial sensation symmetric  7th - facial strength symmetric  8th - hearing intact  9th - palate elevates symmetrically, uvula midline  11th - shoulder shrug symmetric  12th - tongue protrusion midline  MOTOR:   SLOW MOVEMENTS; DIFFUSE 4/5 STRENGTH  SENSORY:   normal and symmetric to light touch  COORDINATION:   finger-nose-finger, fine finger movements SLOW  REFLEXES:   deep tendon reflexes present and symmetric  GAIT/STATION:    IN WHEELCHAIR    Assessment and Plan:  Dx:  1. Seizure disorder (HCC)   2. Developmental delay   3. Hypotension, unspecified hypotension type      SEIZURE DISORDER (stable; last seizures in Feb and Sept 2020) - continue vimpat 200mg  twice a day + divalproex 500mg  twice a day - follow up as needed if any breakthrough seizures   Follow Up Instructions:  - Return for pending if symptoms worsen or fail to improve, return to PCP.   I spent 15 minutes of face-to-face and non-face-to-face time with patient.  This included previsit chart review, lab review, study review, order entry, electronic health record documentation, patient education.      12-07-1992, MD 11/07/2019, 10:45 AM Certified in Neurology, Neurophysiology and Neuroimaging  [facility] [address] [place], [facility] [address] [phone]

## 2019-11-13 ENCOUNTER — Ambulatory Visit: Payer: Medicare Other | Admitting: Podiatry

## 2019-12-29 ENCOUNTER — Ambulatory Visit (INDEPENDENT_AMBULATORY_CARE_PROVIDER_SITE_OTHER): Payer: Medicare Other | Admitting: Podiatry

## 2019-12-29 ENCOUNTER — Encounter: Payer: Self-pay | Admitting: Podiatry

## 2019-12-29 ENCOUNTER — Other Ambulatory Visit: Payer: Self-pay

## 2019-12-29 VITALS — Temp 96.9°F

## 2019-12-29 DIAGNOSIS — B351 Tinea unguium: Secondary | ICD-10-CM | POA: Diagnosis not present

## 2019-12-29 DIAGNOSIS — G808 Other cerebral palsy: Secondary | ICD-10-CM

## 2019-12-29 DIAGNOSIS — M79674 Pain in right toe(s): Secondary | ICD-10-CM | POA: Diagnosis not present

## 2019-12-29 DIAGNOSIS — L600 Ingrowing nail: Secondary | ICD-10-CM

## 2019-12-29 DIAGNOSIS — M79675 Pain in left toe(s): Secondary | ICD-10-CM | POA: Diagnosis not present

## 2019-12-29 NOTE — Patient Instructions (Signed)

## 2020-01-02 ENCOUNTER — Telehealth: Payer: Self-pay | Admitting: *Deleted

## 2020-01-02 NOTE — Telephone Encounter (Addendum)
[facility] - Angela Nevin request discharge summary or notes from 12/28/2019 to be faxed [phone].

## 2020-01-03 NOTE — Telephone Encounter (Signed)
Dr. Eloy End completed the 12/29/2019 form and I faxed to [facility].

## 2020-01-03 NOTE — Progress Notes (Signed)
Subjective: Katherine Wolf presents today for follow up of painful mycotic nails b/l that are difficult to trim. Pain interferes with ambulation. Aggravating factors include wearing enclosed shoe gear. Pain is relieved with periodic professional debridement.   She remains a resident of [facility], [place]. She is accompanied by a caregiver. They voice no new pedal concerns on today's visit.  Allergies  Allergen Reactions  . Penicillins     On Mar- unsure of other information    Objective: Vitals:   12/29/19 0945  Temp: (!) 96.9 F (36.1 C)   Pt 48 y.o. year old Caucasian female in NAD. AAO x 3.   Vascular Examination:  Capillary refill time to digits immediate b/l. Faintly palpable DP pulses b/l. Faintly palpable PT pulses b/l. Pedal hair present b/l. Skin temperature gradient within normal limits b/l.  Dermatological Examination: Pedal skin with normal turgor, texture and tone bilaterally. No open wounds bilaterally. No interdigital macerations bilaterally. Toenails 1-5 b/l elongated, dystrophic, thickened, crumbly with subungual debris and tenderness to dorsal palpation. Left 3rd digit nailplate is mycotic and growing in vertical direction from nailbed. +Tenderness to palpation when moved. No erythema, no edema, no drainage.   Incurvated nailplate right great toe lateral border(s) with tenderness to palpation. No erythema, no edema, no drainage noted.  Musculoskeletal: Normal muscle strength 5/5 to all lower extremity muscle groups bilaterally, no gross bony deformities bilaterally and no pain crepitus or joint limitation noted with ROM b/l  Neurological: Protective sensation intact 5/5 intact bilaterally with 10g monofilament b/l Vibratory sensation intact b/l  Assessment: 1. Pain due to onychomycosis of toenails of both feet   2. Ingrown toenail without infection   3. Other cerebral palsy (HCC)    Plan: -No new findings. No new orders on today's visit. -Toenails 1-5 b/l  were debrided in length and girth with sterile nail nippers and dremel without iatrogenic bleeding. Offending nail border debrided and curretaged right hallux. Border cleansed with alcohol and triple antibiotic applied. No further treatment required by patient/caregiver. -Patient to continue soft, supportive shoe gear daily. -Patient to report any pedal injuries to medical professional immediately. -Patient/POA to call should there be question/concern in the interim.  Return in about 3 months (around 03/30/2020).

## 2020-04-02 ENCOUNTER — Ambulatory Visit (INDEPENDENT_AMBULATORY_CARE_PROVIDER_SITE_OTHER): Payer: Medicare Other | Admitting: Podiatry

## 2020-04-02 ENCOUNTER — Encounter: Payer: Self-pay | Admitting: Podiatry

## 2020-04-02 ENCOUNTER — Other Ambulatory Visit: Payer: Self-pay

## 2020-04-02 DIAGNOSIS — B351 Tinea unguium: Secondary | ICD-10-CM

## 2020-04-02 DIAGNOSIS — M79674 Pain in right toe(s): Secondary | ICD-10-CM

## 2020-04-02 DIAGNOSIS — M79675 Pain in left toe(s): Secondary | ICD-10-CM | POA: Diagnosis not present

## 2020-04-02 NOTE — Patient Instructions (Addendum)

## 2020-04-07 NOTE — Progress Notes (Signed)
Subjective: Katherine Wolf is a pleasant 49 y.o. female patient seen today painful mycotic nails b/l that are difficult to trim. Pain interferes with ambulation. Aggravating factors include wearing enclosed shoe gear. Pain is relieved with periodic professional debridement.  Past Medical History:  Diagnosis Date   Anxiety    Cerebral palsy ([place])    Depression    Hip fracture ([place])    Intellectual disability    Osteoporosis    Schizophrenia ([place])    Seizures ([place])     Patient Active Problem List   Diagnosis Date Noted   Multiple falls 06/27/2019   Gynecologic exam normal 08/15/2018   Elevated d-dimer 05/24/2018   Pain of right lower extremity 05/23/2018   Anxiety 01/12/2018   Cerebral palsy ([place]) 01/12/2018   Delay in development 01/12/2018   GERD (gastroesophageal reflux disease) 01/12/2018   Hip fracture ([place]) 01/12/2018   Hypertension 01/12/2018   Osteopenia of multiple sites 01/12/2018   Schizophrenia ([place]) 01/12/2018   Seizure disorder ([place]) 01/12/2018   Well woman exam 01/12/2018   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      calcium carbonate (OS-CAL) 1250 (500 Ca) MG chewable tablet Chew by mouth.     Calcium Carbonate-Vitamin D 600-400 MG-UNIT tablet Take by mouth.     cholecalciferol (VITAMIN D3) 25 MCG (1000 UT) tablet Take 1,000 Units by mouth daily.     citalopram (CELEXA) 20 MG tablet Take 30 mg by mouth daily.   2   diazepam (DIASTAT ACUDIAL) 10 MG GEL Place rectally as needed for seizure.     divalproex (DEPAKOTE) 500 MG DR tablet Take 500 mg by mouth 2 (two) times daily.      docusate sodium (COLACE) 50 MG capsule Take 100 mg by mouth daily.      hydrOXYzine  (VISTARIL) 50 MG capsule Take 50 mg by mouth 3 (three) times daily as needed.     metoprolol tartrate (LOPRESSOR) 25 MG tablet Take 25 mg by mouth 2 (two) times daily.  2   OLANZapine (ZYPREXA) 2.5 MG tablet Take 2.5 mg by mouth at bedtime.  0   Olopatadine HCl 0.2 % SOLN Place 1 drop into both eyes every morning.      pantoprazole (PROTONIX) 20 MG tablet Take 20 mg by mouth daily.     promethazine (PHENERGAN) 25 MG suppository Place 25 mg rectally every 6 (six) hours as needed for nausea or vomiting.     promethazine (PHENERGAN) 25 MG tablet Take 25 mg by mouth every 6 (six) hours as needed for nausea or vomiting.     VIMPAT 200 MG TABS tablet Take 200 mg by mouth 2 (two) times daily.   5   No current facility-administered medications on file prior to visit.    Allergies  Allergen Reactions   Penicillins     On Mar- unsure of other information    Objective: Physical Exam  General: Katherine Wolf is a pleasant 49 y.o. Caucasian female, in NAD. AAO x 3.   Vascular:  Neurovascular status unchanged b/l lower extremities. Capillary refill time to digits immediate b/l. Faintly palpable pedal pulses b/l. Pedal hair present. Lower extremity skin temperature gradient within normal limits.  Dermatological:  Pedal skin with normal turgor, texture and  tone bilaterally. No open wounds bilaterally. No interdigital macerations bilaterally. Toenails 1-5 b/l elongated, discolored, dystrophic, thickened, crumbly with subungual debris and tenderness to dorsal palpation.  Musculoskeletal:  Normal muscle strength 5/5 to all lower extremity muscle groups bilaterally. No pain crepitus or joint limitation noted with ROM b/l. No gross bony deformities bilaterally.  Neurological:  Protective sensation intact 5/5 intact bilaterally with 10g monofilament b/l. Vibratory sensation intact b/l. Proprioception intact bilaterally.  Assessment and Plan:  1. Pain due to onychomycosis of toenails of both feet     -Examined patient. -No new findings. No new orders. -Toenails 1-5 b/l were debrided in length and girth with sterile nail nippers and dremel without iatrogenic bleeding.  -Patient to report any pedal injuries to medical professional immediately. -Patient to continue soft, supportive shoe gear daily. -Patient/POA to call should there be question/concern in the interim.  Return in about 3 months (around 07/03/2020) for nail trim.  Freddie Breech, DPM

## 2020-04-09 ENCOUNTER — Ambulatory Visit (INDEPENDENT_AMBULATORY_CARE_PROVIDER_SITE_OTHER): Payer: Medicare Other | Admitting: Diagnostic Neuroimaging

## 2020-04-09 ENCOUNTER — Encounter: Payer: Self-pay | Admitting: Diagnostic Neuroimaging

## 2020-04-09 ENCOUNTER — Other Ambulatory Visit: Payer: Self-pay

## 2020-04-09 VITALS — BP 110/77 | HR 96 | Ht 69.0 in | Wt 159.8 lb

## 2020-04-09 DIAGNOSIS — R6250 Unspecified lack of expected normal physiological development in childhood: Secondary | ICD-10-CM | POA: Diagnosis not present

## 2020-04-09 DIAGNOSIS — G40909 Epilepsy, unspecified, not intractable, without status epilepticus: Secondary | ICD-10-CM | POA: Diagnosis not present

## 2020-04-09 MED ORDER — VIMPAT 200 MG PO TABS: 200.0000 mg | ORAL_TABLET | Freq: Two times a day (BID) | ORAL | 5 refills | Status: AC

## 2020-04-09 MED ORDER — DIVALPROEX SODIUM 250 MG PO DR TAB: 750.0000 mg | DELAYED_RELEASE_TABLET | Freq: Two times a day (BID) | ORAL | 4 refills | Status: DC

## 2020-04-09 NOTE — Progress Notes (Signed)
Chief Complaint  Patient presents with  . Seizures    rm 6, caregiver- Leah, "FU due to increase in seizures- 2 seizures within past couple weeks"     History of Present Illness:  UPDATE (04/09/20, VRP): Since last visit, had 4 seizures since last visit. Otherwise symptoms stable. No alleviating or aggravating factors. Tolerating meds.    UPDATE (11/07/19, VRP):  - since last visit, doing well; no seizures; no more falls; using wheelchair, gait belt, 24 hour supervision - tolerating meds; doing well - BP stable  UPDATE (07/31/19 A Lomax): Katherine Wolf is a 50 y.o. female here today for follow up for seizure and recurrent falls.  She continues divalproex 500 mg twice daily as well as Vimpat 200 mg twice daily. She was last seen about 4 weeks ago and concerns were present for orthostatic blood pressures. She was advised to see PCP to review need for metoprolol 25mg  BID.  No medications changes were made.  Patient presents with her caregiver today who states that she is doing much better.  No seizure activity or fall since last being seen 9/16.  She is tolerating medications well without obvious adverse effects.  She is being assessed by physical therapy at [facility].  She reports feeling well today and without concerns.  UPDATE (06/28/2019 A Lomax): Katherine Wolf a 49 y.o.femalewith CPhere today for follow upof seizure and multiple falls. She is a resident 52.Caregiver with her today has limited information but aids in history.Caregiver reports that she has had approx 7 falls in the past month. She has also had multiple witnessed seizures, some requiring ER eval (11/2018 and 06/2019). With each evaluation divalproex was found to be subtherapeutic. On 9/5 divalproex was increased to 500mg  twice daily. ER evaluation yesterday shows valproic acid level of 76. Caregiver thinks that she may have had "a mild seizure" about 4 days ago but uncertain of details. She was  told that Katherine Wolf was standing at the sink in the kitchen and staff heard a loud noise. When they saw her she had fallen to the floor. When they got to her they noticed her eyes were rolling. On call provider called and decision was made to observe her. No seizure activity noted since.  She was started on divalproex 500mg  BID in 04/2018 by PCP. At follow up, 2 weeks later, dose was decreased to 250mg  BID due to concerns of "movement disorder and lethargy."  She is in a room with 5 other patients. She usually walks without assistance. She has not required assistive devices. She needs very minimal assistance with bathing and dressing. Recently, they have started using gait belt and walker or wheelchair for long distances.BP is not routinely checked. Today readings are85/63 (at beginning of visit) and108/60 (with standing). Patient does report feeling dizzy with standing, however, caregiver states that she will repeat words said to her. She is taking metoprolol 25mg  twice daily prescribed by PCP.  PRIOR HPI (10/31/18, VRP): 49 year old female with developmental delay, here for evaluation of seizure disorder. Patient arrives with transporter. Patient and transporter do not have any additional clinical information. I reviewed referring records in chart. Last seizure June 2018. Patient is stable on Vimpat and Depakote. No triggering or aggravating factors.    Observations/Objective:  GENERAL EXAM/CONSTITUTIONAL: Vitals:  Vitals:   04/09/20 0922  BP: 110/77  Pulse: 96  Weight: 159 lb 12.8 oz (72.5 kg)  Height: 5\' 9"  (1.753 m)   Body mass index is 23.6 kg/m. Wt  Readings from Last 3 Encounters:  04/09/20 159 lb 12.8 oz (72.5 kg)  11/07/19 164 lb (74.4 kg)  06/28/19 166 lb 9.6 oz (75.6 kg)    Patient is in no distress; well developed, nourished and groomed; neck is supple  CARDIOVASCULAR:  Examination of carotid arteries is normal; no carotid bruits  Regular rate and rhythm, no  murmurs  Examination of peripheral vascular system by observation and palpation is normal  EYES:  Ophthalmoscopic exam of optic discs and posterior segments is normal; no papilledema or hemorrhages No exam data present  MUSCULOSKELETAL:  Gait, strength, tone, movements noted in Neurologic exam below  NEUROLOGIC: MENTAL STATUS:  No flowsheet data found.  DECR MEMORY   DECR attention and concentration  DECR FLUENCY; FOLLOWS SIMPLE COMMANDS  DECR fund of knowledge  PLEASANT, SMILING  CRANIAL NERVE:   2nd - no papilledema on fundoscopic exam  2nd, 3rd, 4th, 6th - pupils equal and reactive to light, visual fields full to confrontation, extraocular muscles intact, no nystagmus  5th - facial sensation symmetric  7th - facial strength symmetric  8th - hearing intact  9th - palate elevates symmetrically, uvula midline  11th - shoulder shrug symmetric  12th - tongue protrusion midline  MOTOR:   SLOW MOVEMENTS; DIFFUSE 4/5 STRENGTH  SENSORY:   normal and symmetric to light touch  COORDINATION:   finger-nose-finger, fine finger movements SLOW  REFLEXES:   deep tendon reflexes present and symmetric  GAIT/STATION:   USING WALKER    Assessment and Plan:  Dx:  1. Seizure disorder (HCC)   2. Developmental delay      SEIZURE DISORDER (last seizures March, April, June 2021) - continue vimpat 200mg  twice a day - increase divalproex to 750mg  twice a day   Meds ordered this encounter  Medications  . divalproex (DEPAKOTE) 250 MG DR tablet    Sig: Take 3 tablets (750 mg total) by mouth 2 (two) times daily.    Dispense:  540 tablet    Refill:  4  . VIMPAT 200 MG TABS tablet    Sig: Take 1 tablet (200 mg total) by mouth 2 (two) times daily.    Dispense:  60 tablet    Refill:  5     Follow Up Instructions:  - Return in about 9 months (around 01/07/2021) for with NP (Amy Lomax).    , MD 04/09/2020, 9:39 AM Certified in  Neurology, Neurophysiology and Neuroimaging  [facility] [address] [place], [address] [place] [id number]

## 2020-04-09 NOTE — Patient Instructions (Signed)
SEIZURE DISORDER (last seizures March, April, June 2021) - continue vimpat 200mg  twice a day  - increase divalproex to 750mg  twice a day

## 2020-07-16 ENCOUNTER — Ambulatory Visit (INDEPENDENT_AMBULATORY_CARE_PROVIDER_SITE_OTHER): Payer: Medicare Other | Admitting: Podiatry

## 2020-07-16 ENCOUNTER — Other Ambulatory Visit: Payer: Self-pay

## 2020-07-16 DIAGNOSIS — B351 Tinea unguium: Secondary | ICD-10-CM | POA: Diagnosis not present

## 2020-07-16 DIAGNOSIS — M79674 Pain in right toe(s): Secondary | ICD-10-CM

## 2020-07-16 DIAGNOSIS — M79675 Pain in left toe(s): Secondary | ICD-10-CM

## 2020-07-19 ENCOUNTER — Encounter: Payer: Self-pay | Admitting: Podiatry

## 2020-07-19 NOTE — Progress Notes (Signed)
Subjective: Katherine Wolf is a pleasant 49 y.o. female patient seen today painful mycotic nails b/l that are difficult to trim. Pain interferes with ambulation. Aggravating factors include wearing enclosed shoe gear. Pain is relieved with periodic professional debridement.   Caregiver is present during today's visit. They voice no new pedal problems.  Past Medical History:  Diagnosis Date  . Anxiety   . Cerebral palsy (HCC)   . Depression   . Hip fracture (HCC)   . Intellectual disability   . Osteoporosis   . Schizophrenia (HCC)   . Seizures [facility])     Patient Active Problem List   Diagnosis Date Noted  . Multiple falls 06/27/2019  . Closed fracture of nasal bones 06/21/2019  . Deviated septum 06/21/2019  . Gynecologic exam normal 08/15/2018  . Elevated d-dimer 05/24/2018  . Pain of right lower extremity 05/23/2018  . Anxiety 01/12/2018  . Cerebral palsy (HCC) 01/12/2018  . 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 GERD (gastroesophageal reflux disease) 01/12/2018  . Hip fracture (HCC) 01/12/2018  . Hypertension 01/12/2018  . Osteopenia of multiple sites 01/12/2018  . Schizophrenia (HCC) 01/12/2018  . Seizure disorder (HCC) 01/12/2018  . Well woman exam 01/12/2018  . 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 Periprosthetic fracture around internal prosthetic right hip joint (HCC) 09/25/2017    Current Outpatient Medications on File Prior to Visit  Medication Sig Dispense Refill  . acetaminophen (TYLENOL) 325 MG tablet Take 650 mg by mouth at bedtime.     . calcium carbonate (OS-CAL) 1250 (500 Ca) MG chewable tablet Chew by mouth.    . Calcium Carbonate-Vitamin D 600-400 MG-UNIT tablet Take by mouth.    . cholecalciferol (VITAMIN D3) 25 MCG (1000 UT) tablet Take 1,000 Units by mouth daily.    . citalopram (CELEXA) 20 MG tablet Take 30 mg by mouth daily.   2  . diazepam (DIASTAT ACUDIAL) 10 MG GEL Place rectally as needed for seizure.    . divalproex (DEPAKOTE) 250  MG DR tablet Take 3 tablets (750 mg total) by mouth 2 (two) times daily. 540 tablet 4  . docusate sodium (COLACE) 50 MG capsule Take 100 mg by mouth daily.     . hydrOXYzine (VISTARIL) 50 MG capsule Take 50 mg by mouth 3 (three) times daily as needed.    . metoprolol tartrate (LOPRESSOR) 25 MG tablet Take 25 mg by mouth 2 (two) times daily.  2  . OLANZapine (ZYPREXA) 2.5 MG tablet Take 2.5 mg by mouth at bedtime.  0  . Olopatadine HCl 0.2 % SOLN Place 1 drop into both eyes every morning.     . pantoprazole (PROTONIX) 20 MG tablet Take 20 mg by mouth daily.    . promethazine (PHENERGAN) 25 MG suppository Place 25 mg rectally every 6 (six) hours as needed for nausea or vomiting.    . promethazine (PHENERGAN) 25 MG tablet Take 25 mg by mouth every 6 (six) hours as needed for nausea or vomiting.    Marland Kitchen VIMPAT 200 MG TABS tablet Take 1 tablet (200 mg total) by mouth 2 (two) times daily. 60 tablet 5  . doxycycline (VIBRA-TABS) 100 MG tablet Take 100 mg by mouth 2 (two) times daily.    . mupirocin ointment (BACTROBAN) 2 % Apply topically 3 (three) times daily.     No current facility-administered medications on file prior to visit.    Allergies  Allergen Reactions  . Penicillins     On Mar- unsure of other  information    Objective: Physical Exam  General: Katherine Wolf is a pleasant 49 y.o. Caucasian female, in NAD. AAO x 3.   Vascular:  Neurovascular status unchanged b/l lower extremities. Capillary refill time to digits immediate b/l. Faintly palpable pedal pulses b/l. Pedal hair present. Lower extremity skin temperature gradient within normal limits.  Dermatological:  Pedal skin with normal turgor, texture and tone bilaterally. No open wounds bilaterally. No interdigital macerations bilaterally. Toenails 1-5 b/l elongated, discolored, dystrophic, thickened, crumbly with subungual debris and tenderness to dorsal palpation.  Musculoskeletal:  Normal muscle strength 5/5 to all lower extremity  muscle groups bilaterally. No pain crepitus or joint limitation noted with ROM b/l. No gross bony deformities bilaterally.  Neurological:  Protective sensation intact 5/5 intact bilaterally with 10g monofilament b/l. Vibratory sensation intact b/l. Proprioception intact bilaterally.  Assessment and Plan:  1. Pain due to onychomycosis of toenails of both feet    -Examined patient. -No new findings. No new orders. -Toenails 1-5 b/l were debrided in length and girth with sterile nail nippers and dremel without iatrogenic bleeding.  -Patient to report any pedal injuries to medical professional immediately. -Patient to continue soft, supportive shoe gear daily. -Patient/POA to call should there be question/concern in the interim.  Return in about 3 months (around 10/16/2020).  Freddie Breech, DPM

## 2020-07-26 ENCOUNTER — Emergency Department (HOSPITAL_COMMUNITY)
Admission: EM | Admit: 2020-07-26 | Discharge: 2020-07-26 | Disposition: A | Discharge status: Home / Self Care | Payer: Medicare Other | Attending: Emergency Medicine | Admitting: Emergency Medicine

## 2020-07-26 ENCOUNTER — Other Ambulatory Visit: Payer: Self-pay

## 2020-07-26 ENCOUNTER — Emergency Department (HOSPITAL_COMMUNITY): Payer: Medicare Other

## 2020-07-26 ENCOUNTER — Encounter (HOSPITAL_COMMUNITY): Payer: Self-pay | Admitting: Obstetrics and Gynecology

## 2020-07-26 DIAGNOSIS — Z23 Encounter for immunization: Secondary | ICD-10-CM | POA: Diagnosis not present

## 2020-07-26 DIAGNOSIS — M542 Cervicalgia: Secondary | ICD-10-CM | POA: Diagnosis not present

## 2020-07-26 DIAGNOSIS — I10 Essential (primary) hypertension: Secondary | ICD-10-CM | POA: Diagnosis not present

## 2020-07-26 DIAGNOSIS — Z79899 Other long term (current) drug therapy: Secondary | ICD-10-CM | POA: Diagnosis not present

## 2020-07-26 DIAGNOSIS — W19XXXA Unspecified fall, initial encounter: Secondary | ICD-10-CM | POA: Insufficient documentation

## 2020-07-26 DIAGNOSIS — S0990XA Unspecified injury of head, initial encounter: Secondary | ICD-10-CM | POA: Diagnosis present

## 2020-07-26 DIAGNOSIS — S0101XA Laceration without foreign body of scalp, initial encounter: Secondary | ICD-10-CM | POA: Diagnosis not present

## 2020-07-26 LAB — BASIC METABOLIC PANEL
Anion gap: 10 (ref 5–15)
BUN: 10 mg/dL (ref 6–20)
CO2: 27 mmol/L (ref 22–32)
Calcium: 9.6 mg/dL (ref 8.9–10.3)
Chloride: 100 mmol/L (ref 98–111)
Creatinine, Ser: 0.53 mg/dL (ref 0.44–1.00)
GFR, Estimated: >60 mL/min (ref >60)
Glucose, Bld: 82 mg/dL (ref 70–99)
Potassium: 3.9 mmol/L (ref 3.5–5.1)
Sodium: 137 mmol/L (ref 135–145)

## 2020-07-26 LAB — VALPROIC ACID LEVEL: Valproic Acid Lvl: 71 ug/mL (ref 50.0–100.0)

## 2020-07-26 MED ORDER — SODIUM CHLORIDE 0.9 % IV SOLN: INTRAVENOUS | Status: DC

## 2020-07-26 MED ORDER — TETANUS-DIPHTHERIA TOXOIDS TD 5-2 LFU IM INJ
0.5000 mL | INJECTION | Freq: Once | INTRAMUSCULAR | Status: AC
  Administered 2020-07-26: 0.5 mL via INTRAMUSCULAR
  Filled 2020-07-26: qty 0.5

## 2020-07-26 NOTE — ED Notes (Signed)
Patient transported to CT 

## 2020-07-26 NOTE — ED Provider Notes (Signed)
[facility]-EMERGENCY DEPT Provider Note   CSN: [id number] Arrival date & time: 07/26/20  0747     History Chief Complaint  Patient presents with  . Fall    Katherine Wolf is a 49 y.o. female.  49 year old female here after unwitnessed fall at her facility.  Patient has a history of schizophrenia as well as several palsy.  Patient found this morning in her room with a laceration to her occiput.  Patient complains of having some mild neck pain but denies any distal numbness or tingling.  Does not take any blood thinners.  Is reportedly at her neurological baseline.  Possible seizure although not postictal        Past Medical History:  Diagnosis Date  . Anxiety   . Cerebral palsy (HCC)   . Depression   . Hip fracture (HCC)   . Intellectual disability   . Osteoporosis   . Schizophrenia (HCC)   . Seizures [facility])     Patient Active Problem List   Diagnosis Date Noted  . Multiple falls 06/27/2019  . Closed fracture of nasal bones 06/21/2019  . Deviated septum 06/21/2019  . Gynecologic exam normal 08/15/2018  . Elevated d-dimer 05/24/2018  . Pain of right lower extremity 05/23/2018  . Anxiety 01/12/2018  . Cerebral palsy (HCC) 01/12/2018  . 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 GERD (gastroesophageal reflux disease) 01/12/2018  . Hip fracture (HCC) 01/12/2018  . Hypertension 01/12/2018  . Osteopenia of multiple sites 01/12/2018  . Schizophrenia (HCC) 01/12/2018  . Seizure disorder (HCC) 01/12/2018  . Well woman exam 01/12/2018  . 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 Periprosthetic fracture around internal prosthetic right hip joint (HCC) 09/25/2017    No past surgical history on file.   OB History   No obstetric history on file.     No family history on file.  Social History   Tobacco Use  . Smoking status: Never Smoker  . Smokeless tobacco: Never Used  Substance Use Topics  . Alcohol use: Never  . Drug use: Never      Home Medications Prior to Admission medications   Medication Sig Start Date End Date Taking? Authorizing Provider  acetaminophen (TYLENOL) 325 MG tablet Take 650 mg by mouth at bedtime.    Yes [provider]  Calcium Carb-Cholecalciferol (CALCIUM+D3) 600-800 MG-UNIT TABS Take 1 tablet by mouth daily.   Yes [provider]  mirabegron ER (MYRBETRIQ) 50 MG TB24 tablet Take 50 mg by mouth daily.   Yes [provider]  nitrofurantoin (MACRODANTIN) 100 MG capsule Take 100 mg by mouth 4 (four) times daily. 10 day supply 07/20/20 07/29/20 Yes [provider]  calcium carbonate (OS-CAL) 1250 (500 Ca) MG chewable tablet Chew by mouth.    [provider]  Calcium Carbonate-Vitamin D 600-400 MG-UNIT tablet Take by mouth.    [provider]  cholecalciferol (VITAMIN D3) 25 MCG (1000 UT) tablet Take 1,000 Units by mouth daily.    [provider]  citalopram (CELEXA) 20 MG tablet Take 30 mg by mouth daily.  06/24/18   [provider]  diazepam (DIASTAT ACUDIAL) 10 MG GEL Place rectally as needed for seizure.    [provider]  divalproex (DEPAKOTE) 250 MG DR tablet Take 3 tablets (750 mg total) by mouth 2 (two) times daily. 04/09/20 07/03/21  Penumalli, Glenford Bayley, MD  docusate sodium (COLACE) 50 MG capsule Take 100 mg by mouth daily.     [provider]  doxycycline (VIBRA-TABS) 100 MG tablet Take 100 mg by mouth 2 (two) times daily. 03/22/20   [provider]  hydrOXYzine (VISTARIL) 50 MG capsule Take 50 mg by mouth 3 (three) times daily as needed.    [provider]  metoprolol tartrate (LOPRESSOR) 25 MG tablet Take 25 mg by mouth 2 (two) times daily. 06/24/18   [provider]  mupirocin ointment (BACTROBAN) 2 % Apply topically 3 (three) times daily. 03/22/20   [provider]  OLANZapine (ZYPREXA) 2.5 MG tablet Take 2.5 mg by mouth at bedtime. 06/23/18   [provider]   Olopatadine HCl 0.2 % SOLN Place 1 drop into both eyes every morning.     [provider]  pantoprazole (PROTONIX) 20 MG tablet Take 20 mg by mouth daily.    [provider]  promethazine (PHENERGAN) 25 MG suppository Place 25 mg rectally every 6 (six) hours as needed for nausea or vomiting.    [provider]  promethazine (PHENERGAN) 25 MG tablet Take 25 mg by mouth every 6 (six) hours as needed for nausea or vomiting.    [provider]  VIMPAT 200 MG TABS tablet Take 1 tablet (200 mg total) by mouth 2 (two) times daily. 04/09/20   Penumalli, Glenford Bayley, MD    Allergies    Penicillins  Review of Systems   Review of Systems  Unable to perform ROS: Other    Physical Exam Updated Vital Signs There were no vitals taken for this visit.  Physical Exam Vitals and nursing note reviewed.  Constitutional:      General: She is not in acute distress.    Appearance: Normal appearance. She is well-developed. She is not toxic-appearing.  HENT:     Head:   Eyes:     General: Lids are normal.     Conjunctiva/sclera: Conjunctivae normal.     Pupils: Pupils are equal, round, and reactive to light.  Neck:     Thyroid: No thyroid mass.     Trachea: No tracheal deviation.  Cardiovascular:     Rate and Rhythm: Normal rate and regular rhythm.     Heart sounds: Normal heart sounds. No murmur heard.  No gallop.   Pulmonary:     Effort: Pulmonary effort is normal. No respiratory distress.     Breath sounds: Normal breath sounds. No stridor. No decreased breath sounds, wheezing, rhonchi or rales.  Abdominal:     General: Bowel sounds are normal. There is no distension.     Palpations: Abdomen is soft.     Tenderness: There is no abdominal tenderness. There is no rebound.  Musculoskeletal:        General: No tenderness. Normal range of motion.     Cervical back: Normal range of motion and neck supple.  Skin:    General: Skin is warm and dry.     Findings: No  abrasion or rash.  Neurological:     General: No focal deficit present.     Mental Status: She is alert and oriented to person, place, and time.     GCS: GCS eye subscore is 4. GCS verbal subscore is 5. GCS motor subscore is 6.     Cranial Nerves: No cranial nerve deficit.     Sensory: No sensory deficit.     Comments: Patient moves all 4 extremities appropriately  Psychiatric:        Attention and Perception: Attention normal.        Speech:  Speech is delayed.        Behavior: Behavior normal. Behavior is cooperative.     ED Results / Procedures / Treatments   Labs (all labs ordered are listed, but only abnormal results are displayed) Labs Reviewed  VALPROIC ACID LEVEL  BASIC METABOLIC PANEL    EKG None  Radiology No results found.  Procedures Procedures (including critical care time)  Medications Ordered in ED Medications  tetanus & diphtheria toxoids (adult) (TENIVAC) injection 0.5 mL (has no administration in time range)  0.9 %  sodium chloride infusion (has no administration in time range)    ED Course  I have reviewed the triage vital signs and the nursing notes.  Pertinent labs & imaging results that were available during my care of the patient were reviewed by me and considered in my medical decision making (see chart for details).    MDM Rules/Calculators/A&P                          LACERATION REPAIR Performed by: Toy Baker Authorized by: Toy Baker Consent: Verbal consent obtained. Risks and benefits: risks, benefits and alternatives were discussed Consent given by: patient Patient identity confirmed: provided demographic data Prepped and Draped in normal sterile fashion Wound explored  Laceration Location: Scalp  Laceration Length: 1 cm  No Foreign Bodies seen or palpated  Anesthesia: local infiltration   Irrigation method: syringe Amount of cleaning: standard  Skin closure: 1 layer  Number of staples 2  Technique:  Simple  Patient tolerance: Patient tolerated the procedure well with no immediate complications. Final Clinical Impression(s) / ED Diagnoses Final diagnoses:  None   CT of head without acute findings along with CT of C-spine also negative.  Labs are reassuring here.  Will discharge home Rx / DC Orders ED Discharge Orders    None       Lorre Nick, MD 07/26/20 [phone]

## 2020-07-26 NOTE — Discharge Instructions (Addendum)
[facility] out in 7 to 10 days 

## 2020-07-26 NOTE — ED Triage Notes (Signed)
Per EMS-unwitnessed fall this am-head laceration to back of head-possibly hit head on night stand-patient from [facility]-facility thinks she might have had seizure which caused her to fall-no s/s's of seizure activity, patient is not post ictal-

## 2020-10-16 ENCOUNTER — Ambulatory Visit: Payer: Medicare Other | Admitting: Podiatry

## 2020-12-25 ENCOUNTER — Ambulatory Visit (INDEPENDENT_AMBULATORY_CARE_PROVIDER_SITE_OTHER): Payer: Medicare Other | Admitting: Podiatry

## 2020-12-25 ENCOUNTER — Other Ambulatory Visit: Payer: Self-pay

## 2020-12-25 ENCOUNTER — Encounter: Payer: Self-pay | Admitting: Podiatry

## 2020-12-25 DIAGNOSIS — B351 Tinea unguium: Secondary | ICD-10-CM | POA: Diagnosis not present

## 2020-12-25 DIAGNOSIS — M79675 Pain in left toe(s): Secondary | ICD-10-CM | POA: Diagnosis not present

## 2020-12-25 DIAGNOSIS — M79674 Pain in right toe(s): Secondary | ICD-10-CM | POA: Diagnosis not present

## 2020-12-25 NOTE — Progress Notes (Signed)
Subjective: Katherine Wolf is a pleasant 50 y.o. female patient seen today painful mycotic nails b/l that are difficult to trim. Pain interferes with ambulation. Aggravating factors include wearing enclosed shoe gear. Pain is relieved with periodic professional debridement.   Caregiver is present during today's visit. They voice no new pedal problems.  PCP is Dr. Katina Dung. Last visit was 12/25/2020.   Allergies  Allergen Reactions  . Penicillins     On Mar- unsure of other information    Objective: Physical Exam  General: Katherine Wolf is a pleasant 50 y.o. Caucasian female, in NAD. AAO x 3.   Vascular:  Neurovascular status unchanged b/l lower extremities. Capillary refill time to digits immediate b/l. Faintly palpable pedal pulses b/l. Pedal hair present. Lower extremity skin temperature gradient within normal limits.  Dermatological:  Pedal skin with normal turgor, texture and tone bilaterally. No open wounds bilaterally. No interdigital macerations bilaterally. Toenails 1-5 b/l elongated, discolored, dystrophic, thickened, crumbly with subungual debris and tenderness to dorsal palpation.  Musculoskeletal:  Normal muscle strength 5/5 to all lower extremity muscle groups bilaterally. No pain crepitus or joint limitation noted with ROM b/l. No gross bony deformities bilaterally.  Neurological:  Protective sensation intact 5/5 intact bilaterally with 10g monofilament b/l. Vibratory sensation intact b/l. Proprioception intact bilaterally.  Assessment and Plan:  1. Pain due to onychomycosis of toenails of both feet    -Examined patient. -No new findings. No new orders. -Toenails 1-5 b/l were debrided in length and girth with sterile nail nippers and dremel without iatrogenic bleeding.  -Patient to report any pedal injuries to medical professional immediately. -Patient to continue soft, supportive shoe gear daily. -Patient/POA to call should there be question/concern in the  interim.  Return in about 3 months (around 03/27/2021).  Freddie Breech, DPM

## 2021-03-06 ENCOUNTER — Ambulatory Visit: Payer: Medicare Other | Admitting: Family Medicine

## 2021-03-18 ENCOUNTER — Other Ambulatory Visit: Payer: Self-pay | Admitting: Family Medicine

## 2021-03-20 ENCOUNTER — Ambulatory Visit: Payer: Medicare Other | Admitting: Family Medicine

## 2021-04-02 NOTE — Progress Notes (Deleted)
PATIENT: Katherine Wolf DOB: 1971-07-20  REASON FOR VISIT: follow up HISTORY FROM: patient  No chief complaint on file.    HISTORY OF PRESENT ILLNESS: 04/03/2021 ALL: Katherine Wolf returns for follow up for seizures. She was last seen by Dr Marjory Lies in 03/2020. Lacosamide 200mg  BID continued and divalproex increased to 750mg  BID due to breakthrough seizures.    04/09/2020 VRP:  Since last visit, had 4 seizures since last visit. Otherwise symptoms stable. No alleviating or aggravating factors. Tolerating meds.     11/07/2019 VRP:  - since last visit, doing well; no seizures; no more falls; using wheelchair, gait belt, 24 hour supervision - tolerating meds; doing well - BP stable  07/31/2019 ALL:  Katherine Wolf is a 50 y.o. female here today for follow up for seizure and recurrent falls.  She continues divalproex 500 mg twice daily as well as Vimpat 200 mg twice daily. She was last seen about 4 weeks ago and concerns were present for orthostatic blood pressures. She was advised to see PCP to review need for metoprolol 25mg  BID.  No medications changes were made.  Patient presents with her caregiver today who states that she is doing much better.  No seizure activity or fall since last being seen 9/16.  She is tolerating medications well without obvious adverse effects.  She is being assessed by physical therapy at [facility] health services.  She reports feeling well today and without concerns.  HISTORY: (copied from my note on 06/28/2019)  Katherine Wolf is a 50 y.o. female with CP here today for follow up of seizure and multiple falls. She is a resident at 06/30/2019. Caregiver with her today has limited information but aids in history.  Caregiver reports that she has had approx 7 falls in the past month. She has also had multiple witnessed seizures, some requiring ER eval (11/2018 and 06/2019). With each evaluation divalproex was found to be subtherapeutic. On 9/5 divalproex was increased to 500mg  twice  daily.  ER evaluation yesterday shows valproic acid level of 76. Caregiver thinks that she may have had "a mild seizure" about 4 days ago but uncertain of details. She was told that Katherine Wolf was standing at the sink in the kitchen and staff heard a loud noise. When they saw her she had fallen to the floor. When they got to her they noticed her eyes were rolling. On call provider called and decision was made to observe her. No seizure activity noted since.    She was started on divalproex 500mg  BID in 04/2018 by PCP. At follow up, 2 weeks later, dose was decreased to 250mg  BID due to concerns of "movement disorder and lethargy."   She is in a room with 5 other patients. She usually walks without assistance. She has not required assistive devices. She needs very minimal assistance with bathing and dressing. Recently, they have started using gait belt and walker or wheelchair for long distances. BP is not routinely checked. Today readings are 85/63 (at beginning of visit) and 108/60 (with standing). Patient does report feeling dizzy with standing, however, caregiver states that she will repeat words said to her. She is taking metoprolol 25mg  twice daily prescribed by PCP.    HISTORY: (copied from Dr 11/5 note on 10/31/2018)   50 year old female with developmental delay, here for evaluation of seizure disorder.  Patient arrives with transporter.  Patient and transporter do not have any additional clinical information.  I reviewed referring records in chart.  Last seizure  June 2018.  Patient is stable on Vimpat and Depakote.  No triggering or aggravating factors.   REVIEW OF SYSTEMS: Out of a complete 14 system review of symptoms, the patient complains only of the following symptoms, and all other reviewed systems are negative.  ALLERGIES: Allergies  Allergen Reactions   Penicillins     On Mar- unsure of other information    HOME MEDICATIONS: Outpatient Medications Prior to Visit  Medication Sig  Dispense Refill   acetaminophen (TYLENOL) 325 MG tablet Take 650 mg by mouth at bedtime.      Calcium Carb-Cholecalciferol (CALCIUM+D3) 600-800 MG-UNIT TABS Take 1 tablet by mouth daily.     cholecalciferol (VITAMIN D3) 25 MCG (1000 UT) tablet Take 1,000 Units by mouth daily.     citalopram (CELEXA) 20 MG tablet Take 30 mg by mouth daily.   2   diazePAM (VALTOCO 15 MG DOSE NA) Place 1 spray into both nostrils as needed (seizure greater than or equal to 3 mins).     divalproex (DEPAKOTE) 250 MG DR tablet Take 3 tablets (750 mg total) by mouth 2 (two) times daily. 540 tablet 4   docusate sodium (COLACE) 50 MG capsule Take 100 mg by mouth daily.      hydrOXYzine (VISTARIL) 50 MG capsule Take 50 mg by mouth 3 (three) times daily.      mirabegron ER (MYRBETRIQ) 50 MG TB24 tablet Take 50 mg by mouth daily.     OLANZapine (ZYPREXA) 2.5 MG tablet Take 2.5 mg by mouth at bedtime.  0   Olopatadine HCl 0.2 % SOLN Place 1 drop into both eyes every morning.      pantoprazole (PROTONIX) 20 MG tablet Take 20 mg by mouth daily.     promethazine (PHENERGAN) 25 MG suppository Place 25 mg rectally every 4 (four) hours as needed for vomiting.      promethazine (PHENERGAN) 25 MG tablet Take 25 mg by mouth every 4 (four) hours as needed for vomiting.      sodium fluoride (PREVIDENT 5000 PLUS) 1.1 % CREA dental cream Place 1 application onto teeth every evening.     VIMPAT 200 MG TABS tablet Take 1 tablet (200 mg total) by mouth 2 (two) times daily. 60 tablet 5   No facility-administered medications prior to visit.    PAST MEDICAL HISTORY: Past Medical History:  Diagnosis Date   Anxiety    Cerebral palsy (HCC)    Depression    Hip fracture (HCC)    Intellectual disability    Osteoporosis    Schizophrenia (HCC)    Seizures (HCC)     PAST SURGICAL HISTORY: No past surgical history on file.  FAMILY HISTORY: No family history on file.  SOCIAL HISTORY: Social History   Socioeconomic History   Marital  status: Single    Spouse name: Not on file   Number of children: 0   Years of education: Not on file   Highest education level: Not on file  Occupational History   Not on file  Tobacco Use   Smoking status: Never   Smokeless tobacco: Never  Substance and Sexual Activity   Alcohol use: Never   Drug use: Never   Sexual activity: Not Currently  Other Topics Concern   Not on file  Social History Narrative   04/09/20 lives at [facility] group home   Social Determinants of Health   Financial Resource Strain: Not on file  Food Insecurity: Not on file  Transportation Needs: Not on file  Physical Activity: Not on file  Stress: Not on file  Social Connections: Not on file  Intimate Partner Violence: Not on file      PHYSICAL EXAM  There were no vitals filed for this visit.  There is no height or weight on file to calculate BMI.  Generalized: Well developed, in no acute distress  Cardiology: normal rate and rhythm, no murmur noted Neurological examination  Mentation: Alert oriented to place, some history taking. Follows intermittent commands speech and language fluent Cranial nerve II-XII: Pupils were equal round reactive to light. Extraocular movements were full, visual field were full on confrontational test. Not able to fully assess to to patient not following commands  Motor: The motor testing reveals 5 over 5 strength of all 4 extremities. Good symmetric motor tone is noted throughout.  Sensory: Sensory testing is intact to soft touch on all 4 extremities. No evidence of extinction is noted.  Coordination: unable to complete testing due to inattention Gait and station: Gait is mildly spastic  DIAGNOSTIC DATA (LABS, IMAGING, TESTING) - I reviewed patient records, labs, notes, testing and imaging myself where available.  No flowsheet data found.   Lab Results  Component Value Date   WBC 6.1 06/27/2019   HGB 12.2 06/27/2019   HCT 36.3 06/27/2019   MCV 92.6 06/27/2019    PLT 226 06/27/2019      Component Value Date/Time   NA 137 07/26/2020 0823   K 3.9 07/26/2020 0823   CL 100 07/26/2020 0823   CO2 27 07/26/2020 0823   GLUCOSE 82 07/26/2020 0823   BUN 10 07/26/2020 0823   CREATININE 0.53 07/26/2020 0823   CALCIUM 9.6 07/26/2020 0823   PROT 8.2 (H) 12/07/2018 2125   ALBUMIN 4.4 12/07/2018 2125   AST 20 12/07/2018 2125   ALT 10 12/07/2018 2125   ALKPHOS 73 12/07/2018 2125   BILITOT 0.5 12/07/2018 2125   GFRNONAA >60 07/26/2020 0823   GFRAA >60 06/27/2019 1208   No results found for: CHOL, HDL, LDLCALC, LDLDIRECT, TRIG, CHOLHDL No results found for: KZSW1U No results found for: VITAMINB12 No results found for: TSH     ASSESSMENT AND PLAN 50 y.o. year old female  has a past medical history of Anxiety, Cerebral palsy (HCC), Depression, Hip fracture (HCC), Intellectual disability, Osteoporosis, Schizophrenia (HCC), and Seizures (HCC). here with   No diagnosis found.   Katherine Wolf is doing much better today.  There have been no falls and no seizure activity noted.  We will continue divalproex 500 mg twice daily as well as Vimpat 200 mg twice daily.  She will continue to work closely with primary care for concerns of blood pressures.  Fall precautions given.  She is working with PT at her facility for therapy.  She has scheduled follow-up with Dr. Marjory Lies in January.  She and her caregiver verbalized understanding with this plan.   No orders of the defined types were placed in this encounter.    No orders of the defined types were placed in this encounter.     Shawnie Dapper, FNP-C 04/02/2021, 9:21 AM [facility] [address], [address] [place], [place] [address] [phone]

## 2021-04-03 ENCOUNTER — Encounter: Payer: Self-pay | Admitting: Family Medicine

## 2021-04-03 ENCOUNTER — Ambulatory Visit: Payer: Medicare Other | Admitting: Family Medicine

## 2021-04-09 ENCOUNTER — Ambulatory Visit (INDEPENDENT_AMBULATORY_CARE_PROVIDER_SITE_OTHER): Payer: Medicare Other | Admitting: Podiatry

## 2021-04-09 ENCOUNTER — Encounter: Payer: Self-pay | Admitting: Podiatry

## 2021-04-09 ENCOUNTER — Other Ambulatory Visit: Payer: Self-pay

## 2021-04-09 VITALS — BP 125/87 | HR 96

## 2021-04-09 DIAGNOSIS — B351 Tinea unguium: Secondary | ICD-10-CM | POA: Diagnosis not present

## 2021-04-09 DIAGNOSIS — M79675 Pain in left toe(s): Secondary | ICD-10-CM

## 2021-04-09 DIAGNOSIS — M79674 Pain in right toe(s): Secondary | ICD-10-CM | POA: Diagnosis not present

## 2021-04-10 NOTE — Progress Notes (Signed)
  Subjective:  Patient ID: Katherine Wolf, female    DOB: 09/18/1971,  MRN: [id number]  Katherine Wolf presents to clinic today for thick, elongated toenails b/l feet which are tender when wearing enclosed shoe gear.  She has h/o CP and is accompanied by her caregiver on today's visit. Patient stated she had chest pain at beginning of visit and caregiver refutes stating patient repeats what she hears at breakfast table at her facility. Caregiver wanted to continue visit. Patient also has gait belt around her chest.   PCP is [facility], [name] , and last visit was 12/25/2020.  Allergies  Allergen Reactions   Penicillin G     Other reaction(s): Other unknown   Penicillins     On Mar- unsure of other information    Review of Systems: Negative except as noted in the HPI. Objective:   Constitutional Katherine Wolf is a pleasant 50 y.o. Caucasian female, in NAD. AAO x 3.  Vitals:   04/09/21 0942  BP: 125/87  Pulse: 96     Vascular Capillary refill time to digits immediate b/l. Faintly palpable DP pulse(s) b/l lower extremities. Pedal hair present. Lower extremity skin temperature gradient within normal limits. No edema noted b/l lower extremities. No cyanosis or clubbing noted.  Neurologic Normal speech. Oriented to person, place, and time. Protective sensation intact 5/5 intact bilaterally with 10g monofilament b/l. Vibratory sensation intact b/l.  Dermatologic Pedal skin with normal turgor, texture and tone b/l lower extremities No open wounds b/l lower extremities No interdigital macerations b/l lower extremities Toenails 1-5 b/l elongated, discolored, dystrophic, thickened, crumbly with subungual debris and tenderness to dorsal palpation. Incurvated nailplate lateral border(s) R hallux.  Nail border hypertrophy minimal. There is tenderness to palpation. Sign(s) of infection: no clinical signs of infection noted on examination today..  Orthopedic: Normal muscle strength 5/5 to all lower extremity muscle  groups bilaterally. No pain crepitus or joint limitation noted with ROM b/l. No gross bony deformities bilaterally.   Radiographs: None Assessment:   1. Pain due to onychomycosis of toenails of both feet    Plan:  Patient was evaluated and treated and all questions answered.  Onychomycosis with pain -Nails palliatively debridement as below -Educated on self-care  Procedure: Nail Debridement Rationale: Pain Type of Debridement: manual, sharp debridement. Instrumentation: Nail nipper, rotary burr. Number of Nails: 10 -Examined patient. Vitals were taken at beginning of visit and found to be stable. Patient did well during the entire visit left office in no distress. -Patient to continue soft, supportive shoe gear daily. -Toenails 1-5 b/l were debrided in length and girth with sterile nail nippers and dremel without iatrogenic bleeding.  -Offending nail border debrided and curretaged R hallux utilizing sterile nail nipper and currette. Border cleansed with alcohol and triple antibiotic applied. No further treatment required by patient/caregiver. -Patient to report any pedal injuries to medical professional immediately. -Patient/POA to call should there be question/concern in the interim.  Return in about 3 months (around 07/10/2021).  Freddie Breech, DPM

## 2021-04-23 ENCOUNTER — Telehealth: Payer: Self-pay | Admitting: Diagnostic Neuroimaging

## 2021-04-23 NOTE — Telephone Encounter (Signed)
Called Katherine Wolf, no answer, voice MB full.

## 2021-04-23 NOTE — Telephone Encounter (Signed)
[facility] called to r/s pts appt. They stated that the pt has had increased seizure activity and did not want to wait till Oct appt that was offered with NP. Pt has been scheduled with MD who had a sooner appt.

## 2021-04-24 NOTE — Telephone Encounter (Signed)
Called Katherine Wolf again to schedule patient sooner, no answer and VMB full.

## 2021-05-27 ENCOUNTER — Other Ambulatory Visit: Payer: Self-pay

## 2021-05-27 ENCOUNTER — Emergency Department (HOSPITAL_COMMUNITY): Payer: Medicare Other

## 2021-05-27 ENCOUNTER — Emergency Department (HOSPITAL_COMMUNITY)
Admission: EM | Admit: 2021-05-27 | Discharge: 2021-05-27 | Disposition: A | Discharge status: Home / Self Care | Payer: Medicare Other | Attending: Emergency Medicine | Admitting: Emergency Medicine

## 2021-05-27 ENCOUNTER — Encounter (HOSPITAL_COMMUNITY): Payer: Self-pay

## 2021-05-27 DIAGNOSIS — F88 Other disorders of psychological development: Secondary | ICD-10-CM | POA: Insufficient documentation

## 2021-05-27 DIAGNOSIS — I10 Essential (primary) hypertension: Secondary | ICD-10-CM | POA: Diagnosis not present

## 2021-05-27 DIAGNOSIS — N12 Tubulo-interstitial nephritis, not specified as acute or chronic: Secondary | ICD-10-CM | POA: Insufficient documentation

## 2021-05-27 DIAGNOSIS — Z96649 Presence of unspecified artificial hip joint: Secondary | ICD-10-CM | POA: Diagnosis not present

## 2021-05-27 DIAGNOSIS — R000 Tachycardia, unspecified: Secondary | ICD-10-CM | POA: Insufficient documentation

## 2021-05-27 DIAGNOSIS — R109 Unspecified abdominal pain: Secondary | ICD-10-CM | POA: Diagnosis present

## 2021-05-27 LAB — COMPREHENSIVE METABOLIC PANEL
ALT: 9 U/L (ref 0–44)
AST: 12 U/L — ABNORMAL LOW (ref 15–41)
Albumin: 3.3 g/dL — ABNORMAL LOW (ref 3.5–5.0)
Alkaline Phosphatase: 61 U/L (ref 38–126)
Anion gap: 10 (ref 5–15)
BUN: 11 mg/dL (ref 6–20)
CO2: 25 mmol/L (ref 22–32)
Calcium: 8.8 mg/dL — ABNORMAL LOW (ref 8.9–10.3)
Chloride: 96 mmol/L — ABNORMAL LOW (ref 98–111)
Creatinine, Ser: 0.49 mg/dL (ref 0.44–1.00)
GFR, Estimated: >60 mL/min (ref >60)
Glucose, Bld: 103 mg/dL — ABNORMAL HIGH (ref 70–99)
Potassium: 3.5 mmol/L (ref 3.5–5.1)
Sodium: 131 mmol/L — ABNORMAL LOW (ref 135–145)
Total Bilirubin: 0.5 mg/dL (ref 0.3–1.2)
Total Protein: 7.6 g/dL (ref 6.5–8.1)

## 2021-05-27 LAB — URINALYSIS, ROUTINE W REFLEX MICROSCOPIC
Bilirubin Urine: NEGATIVE
Glucose, UA: NEGATIVE mg/dL
Ketones, ur: 20 mg/dL — AB
Nitrite: NEGATIVE
Protein, ur: 100 mg/dL — AB
Specific Gravity, Urine: 1.018 (ref 1.005–1.030)
pH: 5 (ref 5.0–8.0)

## 2021-05-27 LAB — CBC WITH DIFFERENTIAL/PLATELET
Abs Immature Granulocytes: 0.14 10*3/uL — ABNORMAL HIGH (ref 0.00–0.07)
Basophils Absolute: 0 10*3/uL (ref 0.0–0.1)
Basophils Relative: 0 %
Eosinophils Absolute: 0 10*3/uL (ref 0.0–0.5)
Eosinophils Relative: 0 %
HCT: 32.8 % — ABNORMAL LOW (ref 36.0–46.0)
Hemoglobin: 11.4 g/dL — ABNORMAL LOW (ref 12.0–15.0)
Immature Granulocytes: 1 %
Lymphocytes Relative: 5 %
Lymphs Abs: 0.9 10*3/uL (ref 0.7–4.0)
MCH: 31.7 pg (ref 26.0–34.0)
MCHC: 34.8 g/dL (ref 30.0–36.0)
MCV: 91.1 fL (ref 80.0–100.0)
Monocytes Absolute: 4.5 10*3/uL — ABNORMAL HIGH (ref 0.1–1.0)
Monocytes Relative: 23 %
Neutro Abs: 14.4 10*3/uL — ABNORMAL HIGH (ref 1.7–7.7)
Neutrophils Relative %: 71 %
Platelets: 152 10*3/uL (ref 150–400)
RBC: 3.6 MIL/uL — ABNORMAL LOW (ref 3.87–5.11)
RDW: 13.2 % (ref 11.5–15.5)
WBC: 20 10*3/uL — ABNORMAL HIGH (ref 4.0–10.5)
nRBC: 0 % (ref 0.0–0.2)

## 2021-05-27 LAB — LACTIC ACID, PLASMA
Lactic Acid, Venous: 0.9 mmol/L (ref 0.5–1.9)
Lactic Acid, Venous: 1.2 mmol/L (ref 0.5–1.9)

## 2021-05-27 LAB — PROTIME-INR
INR: 0.9 (ref 0.8–1.2)
Prothrombin Time: 12.6 seconds (ref 11.4–15.2)

## 2021-05-27 LAB — VALPROIC ACID LEVEL: Valproic Acid Lvl: 73 ug/mL (ref 50.0–100.0)

## 2021-05-27 MED ORDER — CEPHALEXIN 500 MG PO CAPS: 500.0000 mg | ORAL_CAPSULE | Freq: Four times a day (QID) | ORAL | 0 refills | Status: DC

## 2021-05-27 MED ORDER — IOHEXOL 350 MG/ML SOLN
80.0000 mL | Freq: Once | INTRAVENOUS | Status: AC | PRN
  Administered 2021-05-27: 80 mL via INTRAVENOUS

## 2021-05-27 MED ORDER — ACETAMINOPHEN 325 MG PO TABS
650.0000 mg | ORAL_TABLET | Freq: Once | ORAL | Status: AC
  Administered 2021-05-27: 650 mg via ORAL
  Filled 2021-05-27: qty 2

## 2021-05-27 MED ORDER — SODIUM CHLORIDE 0.9 % IV SOLN
1.0000 g | INTRAVENOUS | Status: DC
  Administered 2021-05-27: 1 g via INTRAVENOUS
  Filled 2021-05-27: qty 10

## 2021-05-27 NOTE — ED Triage Notes (Signed)
Patient brought in via ems from group home [facility]. Patient c/o abd pain for 2 days. Patient febrile and tachy on monitor. Hx of CP. Denies other symptoms at this time

## 2021-05-27 NOTE — ED Notes (Signed)
Katherine Wolf [facility] would like a call back with a patient update [phone]

## 2021-05-27 NOTE — ED Provider Notes (Signed)
[facility]-EMERGENCY DEPT Provider Note   CSN: [id number] Arrival date & time: 05/27/21  1423     History Chief Complaint  Patient presents with   Abdominal Pain    Katherine Wolf is a 50 y.o. female.  50 year old female with history of cerebral palsy and schizophrenia presents with abdominal pain for several days.  Due to her baseline departmental delay history is limited.  Was brought in from her facility group home due to fever and abdominal discomfort.  Patient states that she has had no vomiting or emesis.  Does not have any urinary symptoms.      Past Medical History:  Diagnosis Date   Anxiety    Cerebral palsy (HCC)    Depression    Hip fracture (HCC)    Intellectual disability    Osteoporosis    Schizophrenia (HCC)    Seizures (HCC)     Patient Active Problem List   Diagnosis Date Noted   Multiple falls 06/27/2019   Closed fracture of nasal bones 06/21/2019   Deviated septum 06/21/2019   Gynecologic exam normal 08/15/2018   Elevated d-dimer 05/24/2018   Pain of right lower extremity 05/23/2018   Anxiety 01/12/2018   Cerebral palsy (HCC) 01/12/2018   Delay in development 01/12/2018   GERD (gastroesophageal reflux disease) 01/12/2018   Hip fracture (HCC) 01/12/2018   Hypertension 01/12/2018   Osteopenia of multiple sites 01/12/2018   Schizophrenia (HCC) 01/12/2018   Seizure disorder (HCC) 01/12/2018   Well woman exam 01/12/2018   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 No pertinent surgical history.   OB History   No obstetric history on file.     History reviewed. No pertinent family history.  Social History   Tobacco Use   Smoking status: Never   Smokeless tobacco: Never  Substance Use Topics   Alcohol use: Never   Drug use: Never    Home Medications Prior to Admission medications   Medication  Sig Start Date End Date Taking? Authorizing Provider  acetaminophen (TYLENOL) 325 MG tablet Take 650 mg by mouth at bedtime.     [provider]  Calcium Carb-Cholecalciferol (CALCIUM+D3) 600-800 MG-UNIT TABS Take 1 tablet by mouth daily.    [provider]  Calcium Carb-Cholecalciferol 500-600 MG-UNIT TABS Take by mouth.    [provider]  cholecalciferol (VITAMIN D3) 25 MCG (1000 UT) tablet Take 1,000 Units by mouth daily.    [provider]  Cholecalciferol 25 MCG (1000 UT) tablet Take by mouth.    [provider]  citalopram (CELEXA) 20 MG tablet Take by mouth.    [provider]  diazePAM (VALTOCO 15 MG DOSE NA) Place 1 spray into both nostrils as needed (seizure greater than or equal to 3 mins).    [provider]  DIAZEPAM PO Place into the nose.    [provider]  divalproex (DEPAKOTE) 250 MG DR tablet Take 3 tablets (750 mg total) by mouth 2 (two) times daily. 04/09/20 07/03/21  Penumalli, Glenford Bayley, MD  divalproex (DEPAKOTE) 250 MG DR tablet Take by mouth. 04/09/20   [provider]  Docusate Sodium 150 MG/15ML syrup Take by mouth.    [provider]  hydrOXYzine (ATARAX/VISTARIL) 50 MG tablet Take by mouth.    [provider]  hydrOXYzine (VISTARIL) 50 MG capsule Take 50 mg by mouth 3 (three) times daily.  [provider]  melatonin 3 MG TABS tablet Take by mouth.    [provider]  mirabegron ER (MYRBETRIQ) 50 MG TB24 tablet Take 50 mg by mouth daily.    [provider]  nitrofurantoin (MACRODANTIN) 100 MG capsule Take 100 mg by mouth 4 (four) times daily. 03/26/21   [provider]  OLANZapine (ZYPREXA) 2.5 MG tablet every night.    [provider]  Olopatadine HCl 0.2 % SOLN Place 1 drop into both eyes every morning.     [provider]  Olopatadine HCl 0.2 % SOLN 1 drop 1 (one) time each day.    [provider]   pantoprazole (PROTONIX) 20 MG tablet Take 20 mg by mouth daily.    [provider]  pantoprazole (PROTONIX) 20 MG tablet Take by mouth.    [provider]  polyethylene glycol powder (GLYCOLAX/MIRALAX) 17 GM/SCOOP powder Take by mouth.    [provider]  promethazine (PHENERGAN) 25 MG suppository Place 25 mg rectally every 4 (four) hours as needed for vomiting.     [provider]  promethazine (PHENERGAN) 25 MG suppository Place rectally.    [provider]  promethazine (PHENERGAN) 25 MG tablet Take 25 mg by mouth every 4 (four) hours as needed for vomiting.     [provider]  promethazine (PHENERGAN) 25 MG tablet Take by mouth.    [provider]  senna-docusate (SENOKOT-S) 8.6-50 MG tablet Take 1 tablet by mouth daily.    [provider]  sodium fluoride (PREVIDENT 5000 PLUS) 1.1 % CREA dental cream Place 1 application onto teeth every evening.    [provider]  VIMPAT 200 MG TABS tablet Take 1 tablet (200 mg total) by mouth 2 (two) times daily. 04/09/20   Penumalli, Glenford Bayley, MD    Allergies    Penicillin g and Penicillins  Review of Systems   Review of Systems  Unable to perform ROS: Other   Physical Exam Updated Vital Signs BP (!) 102/37   Pulse (!) 120   Temp (!) 103 F (39.4 C) (Oral)   Resp (!) 22   Ht 1.753 m (5\' 9" )   Wt 72.6 kg   SpO2 98%   BMI 23.63 kg/m   Physical Exam Vitals and nursing note reviewed.  Constitutional:      General: She is not in acute distress.    Appearance: Normal appearance. She is well-developed. She is not toxic-appearing.  HENT:     Head: Normocephalic and atraumatic.  Eyes:     General: Lids are normal.     Conjunctiva/sclera: Conjunctivae normal.     Pupils: Pupils are equal, round, and reactive to light.  Neck:     Thyroid: No thyroid mass.     Trachea: No tracheal deviation.  Cardiovascular:     Rate and Rhythm: Regular rhythm. Tachycardia  present.     Heart sounds: Normal heart sounds. No murmur heard.   No gallop.  Pulmonary:     Effort: Pulmonary effort is normal. No respiratory distress.     Breath sounds: Normal breath sounds. No stridor. No decreased breath sounds, wheezing, rhonchi or rales.  Abdominal:     General: There is no distension.     Palpations: Abdomen is soft.     Tenderness: There is generalized abdominal tenderness. There is guarding. There is no rebound.  Musculoskeletal:        General: No tenderness. Normal range of motion.  Cervical back: Normal range of motion and neck supple.  Skin:    General: Skin is warm and dry.     Findings: No abrasion or rash.  Neurological:     Mental Status: She is alert and oriented to person, place, and time.     GCS: GCS eye subscore is 4. GCS verbal subscore is 5. GCS motor subscore is 6.     Cranial Nerves: Cranial nerves are intact. No cranial nerve deficit.     Sensory: No sensory deficit.     Motor: Motor function is intact.  Psychiatric:        Attention and Perception: Attention normal.        Mood and Affect: Affect is flat.    ED Results / Procedures / Treatments   Labs (all labs ordered are listed, but only abnormal results are displayed) Labs Reviewed  CULTURE, BLOOD (ROUTINE X 2)  CULTURE, BLOOD (ROUTINE X 2)  URINE CULTURE  COMPREHENSIVE METABOLIC PANEL  LACTIC ACID, PLASMA  LACTIC ACID, PLASMA  CBC WITH DIFFERENTIAL/PLATELET  PROTIME-INR  URINALYSIS, ROUTINE W REFLEX MICROSCOPIC    EKG None  Radiology No results found.  Procedures Procedures   Medications Ordered in ED Medications  acetaminophen (TYLENOL) tablet 650 mg (650 mg Oral Given 05/27/21 1541)    ED Course  I have reviewed the triage vital signs and the nursing notes.  Pertinent labs & imaging results that were available during my care of the patient were reviewed by me and considered in my medical decision making (see chart for details).    MDM  Rules/Calculators/A&P                           Patient is urinalysis consistent with infection.  Abdominal CT shows evidence of pyelonephritis.  Patient had records from nursing facility that shows patient with possible increased seizures.  Will check Depakote level here.  She has had no seizure activity here.  Patient with normal Depakote level.  Will discharge home on Keflex Final Clinical Impression(s) / ED Diagnoses Final diagnoses:  None    Rx / DC Orders ED Discharge Orders     None        Lorre Nick, MD 05/27/21 2249

## 2021-05-27 NOTE — ED Notes (Signed)
Facility transport on the way get patient, states should be 15-20 minutes before arrival.

## 2021-05-29 ENCOUNTER — Telehealth (HOSPITAL_BASED_OUTPATIENT_CLINIC_OR_DEPARTMENT_OTHER): Payer: Self-pay | Admitting: Emergency Medicine

## 2021-05-29 LAB — BLOOD CULTURE ID PANEL (REFLEXED) - BCID2

## 2021-05-29 NOTE — Telephone Encounter (Signed)
Received call for positive blood culture; gram negative rods; E.Coli. Reviewed results and chart with Dr Lockie Mola. Advised patient needs to be reevaluated at [facility]. Called group home and spoke with Bubba Camp; advised of results and discussed need for patient to be seen at [facility] ED for positive blood culture results. Verbalized understanding.

## 2021-05-30 ENCOUNTER — Emergency Department (HOSPITAL_COMMUNITY)
Admission: EM | Admit: 2021-05-30 | Discharge: 2021-05-30 | Disposition: A | Discharge status: Home / Self Care | Payer: Medicare Other | Attending: Emergency Medicine | Admitting: Emergency Medicine

## 2021-05-30 ENCOUNTER — Encounter (HOSPITAL_COMMUNITY): Payer: Self-pay

## 2021-05-30 ENCOUNTER — Other Ambulatory Visit: Payer: Self-pay

## 2021-05-30 DIAGNOSIS — Z79899 Other long term (current) drug therapy: Secondary | ICD-10-CM | POA: Diagnosis not present

## 2021-05-30 DIAGNOSIS — R000 Tachycardia, unspecified: Secondary | ICD-10-CM | POA: Diagnosis not present

## 2021-05-30 DIAGNOSIS — D72829 Elevated white blood cell count, unspecified: Secondary | ICD-10-CM | POA: Diagnosis not present

## 2021-05-30 DIAGNOSIS — Z20822 Contact with and (suspected) exposure to covid-19: Secondary | ICD-10-CM | POA: Insufficient documentation

## 2021-05-30 DIAGNOSIS — B9620 Unspecified Escherichia coli [E. coli] as the cause of diseases classified elsewhere: Secondary | ICD-10-CM

## 2021-05-30 DIAGNOSIS — R7881 Bacteremia: Secondary | ICD-10-CM | POA: Diagnosis not present

## 2021-05-30 DIAGNOSIS — Z96641 Presence of right artificial hip joint: Secondary | ICD-10-CM | POA: Insufficient documentation

## 2021-05-30 DIAGNOSIS — Z3A00 Weeks of gestation of pregnancy not specified: Secondary | ICD-10-CM | POA: Insufficient documentation

## 2021-05-30 DIAGNOSIS — O26899 Other specified pregnancy related conditions, unspecified trimester: Secondary | ICD-10-CM | POA: Insufficient documentation

## 2021-05-30 DIAGNOSIS — I10 Essential (primary) hypertension: Secondary | ICD-10-CM | POA: Insufficient documentation

## 2021-05-30 DIAGNOSIS — N12 Tubulo-interstitial nephritis, not specified as acute or chronic: Secondary | ICD-10-CM | POA: Insufficient documentation

## 2021-05-30 DIAGNOSIS — N9489 Other specified conditions associated with female genital organs and menstrual cycle: Secondary | ICD-10-CM | POA: Diagnosis not present

## 2021-05-30 LAB — I-STAT BETA HCG BLOOD, ED (MC, WL, AP ONLY): I-stat hCG, quantitative: 16.1 m[IU]/mL — ABNORMAL HIGH (ref <5)

## 2021-05-30 LAB — CBC WITH DIFFERENTIAL/PLATELET
Abs Immature Granulocytes: 0.02 10*3/uL (ref 0.00–0.07)
Basophils Absolute: 0 10*3/uL (ref 0.0–0.1)
Basophils Relative: 0 %
Eosinophils Absolute: 0 10*3/uL (ref 0.0–0.5)
Eosinophils Relative: 1 %
HCT: 33.7 % — ABNORMAL LOW (ref 36.0–46.0)
Hemoglobin: 11.6 g/dL — ABNORMAL LOW (ref 12.0–15.0)
Immature Granulocytes: 0 %
Lymphocytes Relative: 22 %
Lymphs Abs: 1.4 10*3/uL (ref 0.7–4.0)
MCH: 31 pg (ref 26.0–34.0)
MCHC: 34.4 g/dL (ref 30.0–36.0)
MCV: 90.1 fL (ref 80.0–100.0)
Monocytes Absolute: 1 10*3/uL (ref 0.1–1.0)
Monocytes Relative: 16 %
Neutro Abs: 3.7 10*3/uL (ref 1.7–7.7)
Neutrophils Relative %: 61 %
Platelets: 259 10*3/uL (ref 150–400)
RBC: 3.74 MIL/uL — ABNORMAL LOW (ref 3.87–5.11)
RDW: 13 % (ref 11.5–15.5)
WBC: 6.1 10*3/uL (ref 4.0–10.5)
nRBC: 0 % (ref 0.0–0.2)

## 2021-05-30 LAB — RESP PANEL BY RT-PCR (FLU A&B, COVID) ARPGX2
Influenza A by PCR: NEGATIVE
Influenza B by PCR: NEGATIVE
SARS Coronavirus 2 by RT PCR: NEGATIVE

## 2021-05-30 LAB — COMPREHENSIVE METABOLIC PANEL
ALT: 10 U/L (ref 0–44)
AST: 13 U/L — ABNORMAL LOW (ref 15–41)
Albumin: 3.2 g/dL — ABNORMAL LOW (ref 3.5–5.0)
Alkaline Phosphatase: 59 U/L (ref 38–126)
Anion gap: 11 (ref 5–15)
BUN: 10 mg/dL (ref 6–20)
CO2: 27 mmol/L (ref 22–32)
Calcium: 9.4 mg/dL (ref 8.9–10.3)
Chloride: 100 mmol/L (ref 98–111)
Creatinine, Ser: 0.5 mg/dL (ref 0.44–1.00)
GFR, Estimated: >60 mL/min (ref >60)
Glucose, Bld: 95 mg/dL (ref 70–99)
Potassium: 3.4 mmol/L — ABNORMAL LOW (ref 3.5–5.1)
Sodium: 138 mmol/L (ref 135–145)
Total Bilirubin: 0.4 mg/dL (ref 0.3–1.2)
Total Protein: 8.1 g/dL (ref 6.5–8.1)

## 2021-05-30 LAB — URINE CULTURE: Culture: >=100000 — AB

## 2021-05-30 LAB — HCG, QUANTITATIVE, PREGNANCY: hCG, Beta Chain, Quant, S: 6 m[IU]/mL — ABNORMAL HIGH (ref <5)

## 2021-05-30 LAB — LACTIC ACID, PLASMA: Lactic Acid, Venous: 1.6 mmol/L (ref 0.5–1.9)

## 2021-05-30 MED ORDER — MORPHINE SULFATE (PF) 4 MG/ML IV SOLN
4.0000 mg | Freq: Once | INTRAVENOUS | Status: AC
  Administered 2021-05-30: 4 mg via INTRAVENOUS
  Filled 2021-05-30: qty 1

## 2021-05-30 MED ORDER — CEPHALEXIN 500 MG PO CAPS: 500.0000 mg | ORAL_CAPSULE | Freq: Four times a day (QID) | ORAL | 0 refills | Status: AC

## 2021-05-30 MED ORDER — ONDANSETRON HCL 4 MG/2ML IJ SOLN
4.0000 mg | Freq: Once | INTRAMUSCULAR | Status: AC
Start: 2021-05-30 — End: 2021-05-30
  Administered 2021-05-30: 4 mg via INTRAVENOUS
  Filled 2021-05-30: qty 2

## 2021-05-30 MED ORDER — POTASSIUM CHLORIDE CRYS ER 20 MEQ PO TBCR
40.0000 meq | EXTENDED_RELEASE_TABLET | Freq: Once | ORAL | Status: AC
  Administered 2021-05-30: 40 meq via ORAL
  Filled 2021-05-30: qty 2

## 2021-05-30 MED ORDER — SODIUM CHLORIDE 0.9 % IV SOLN
2.0000 g | Freq: Once | INTRAVENOUS | Status: AC
  Administered 2021-05-30: 2 g via INTRAVENOUS
  Filled 2021-05-30: qty 20

## 2021-05-30 MED ORDER — ONDANSETRON 4 MG PO TBDP: 4.0000 mg | ORAL_TABLET | Freq: Three times a day (TID) | ORAL | 0 refills | Status: AC | PRN

## 2021-05-30 MED ORDER — SODIUM CHLORIDE 0.9 % IV BOLUS
500.0000 mL | Freq: Once | INTRAVENOUS | Status: AC
Start: 2021-05-30 — End: 2021-05-30
  Administered 2021-05-30: 500 mL via INTRAVENOUS

## 2021-05-30 NOTE — Discharge Instructions (Addendum)
Take the medications as prescribed  Return for new or worsening symptoms 

## 2021-05-30 NOTE — ED Provider Notes (Signed)
[facility] Provider Note   CSN: [id number] Arrival date & time: 05/30/21  1107    History    Katherine Wolf is a 50 y.o. female with past medical history significant for cerebral palsy, schizophrenia, recurrent falls who presents for evaluation of abnormal lab.  Patient member of group home.  Recently seen here 3 days ago noted to be febrile, tachycardic.  CT scan showed right-sided pyelonephritis.  UA grew out greater than 100,000 and E. coli.  Facility was notified that patient had positive blood cultures.  She has been compliant with her ABX at home.  She continues to have subjective fevers, pain as well as intermittent nausea.  No headache, chest pain, shortness of breath, dysuria, rashes or lesions.  Denies additional aggravating or alleviating factors. Has not taken temp at facility.  History obtained from patient, facility nurse Talasia at [phone] and past medical records.  No interpreter used.    HPI     Past Medical History:  Diagnosis Date   Anxiety    Cerebral palsy (HCC)    Depression    Hip fracture (HCC)    Intellectual disability    Osteoporosis    Schizophrenia (HCC)    Seizures (HCC)     Patient Active Problem List   Diagnosis Date Noted   Multiple falls 06/27/2019   Closed fracture of nasal bones 06/21/2019   Deviated septum 06/21/2019   Gynecologic exam normal 08/15/2018   Elevated d-dimer 05/24/2018   Pain of right lower extremity 05/23/2018   Anxiety 01/12/2018   Cerebral palsy (HCC) 01/12/2018   Delay in development 01/12/2018   GERD (gastroesophageal reflux disease) 01/12/2018   Hip fracture (HCC) 01/12/2018   Hypertension 01/12/2018   Osteopenia of multiple sites 01/12/2018   Schizophrenia (HCC) 01/12/2018   Seizure disorder (HCC) 01/12/2018   Well woman exam 01/12/2018   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 No pertinent surgical history.   OB History   No obstetric history on file.     History reviewed. No pertinent family history.  Social History   Tobacco Use   Smoking status: Never   Smokeless tobacco: Never  Vaping Use   Vaping Use: Never used  Substance Use Topics   Alcohol use: Never   Drug use: Never    Home Medications Prior to Admission medications   Medication Sig Start Date End Date Taking? Authorizing Provider  cephALEXin (KEFLEX) 500 MG capsule Take 1 capsule (500 mg total) by mouth 4 (four) times daily for 7 days. 05/30/21 06/06/21 Yes Francesa Eugenio A, PA-C  ondansetron (ZOFRAN ODT) 4 MG disintegrating tablet Take 1 tablet (4 mg total) by mouth every 8 (eight) hours as needed for nausea or vomiting. 05/30/21  Yes Alyse Kathan A, PA-C  acetaminophen (TYLENOL) 325 MG tablet Take 650 mg by mouth at bedtime.     [provider]  Calcium Carb-Cholecalciferol (CALCIUM+D3) 600-800 MG-UNIT TABS Take 1 tablet by mouth daily.    [provider]  Calcium Carb-Cholecalciferol 500-600 MG-UNIT TABS Take by mouth.    [provider]  cholecalciferol (VITAMIN D3) 25 MCG (1000 UT) tablet Take 1,000 Units by mouth daily.    [provider]  Cholecalciferol 25 MCG (1000 UT) tablet Take by mouth.    [provider]  citalopram (CELEXA) 20 MG tablet Take by mouth.    [provider]  diazePAM (VALTOCO 15 MG DOSE NA) Place 1 spray into both nostrils as needed (seizure greater than or equal to 3 mins).    [provider]  DIAZEPAM PO Place into the nose.    [provider]  divalproex (DEPAKOTE) 250 MG DR tablet Take 3 tablets (750 mg total) by mouth 2 (two) times daily. 04/09/20 07/03/21  Penumalli, Glenford BayleyVikram R, MD  divalproex (DEPAKOTE) 250 MG DR tablet Take by mouth. 04/09/20   [provider]  Docusate Sodium 150 MG/15ML syrup Take by mouth.    [provider]   hydrOXYzine (ATARAX/VISTARIL) 50 MG tablet Take by mouth.    [provider]  hydrOXYzine (VISTARIL) 50 MG capsule Take 50 mg by mouth 3 (three) times daily.     [provider]  melatonin 3 MG TABS tablet Take by mouth.    [provider]  mirabegron ER (MYRBETRIQ) 50 MG TB24 tablet Take 50 mg by mouth daily.    [provider]  nitrofurantoin (MACRODANTIN) 100 MG capsule Take 100 mg by mouth 4 (four) times daily. 03/26/21   [provider]  OLANZapine (ZYPREXA) 2.5 MG tablet every night.    [provider]  Olopatadine HCl 0.2 % SOLN Place 1 drop into both eyes every morning.     [provider]  Olopatadine HCl 0.2 % SOLN 1 drop 1 (one) time each day.    [provider]  pantoprazole (PROTONIX) 20 MG tablet Take 20 mg by mouth daily.    [provider]  pantoprazole (PROTONIX) 20 MG tablet Take by mouth.    [provider]  polyethylene glycol powder (GLYCOLAX/MIRALAX) 17 GM/SCOOP powder Take by mouth.    [provider]  promethazine (PHENERGAN) 25 MG suppository Place 25 mg rectally every 4 (four) hours as needed for vomiting.     [provider]  promethazine (PHENERGAN) 25 MG suppository Place rectally.    [provider]  promethazine (PHENERGAN) 25 MG tablet Take 25 mg by mouth every 4 (four) hours as needed for vomiting.     [provider]  promethazine (PHENERGAN) 25 MG tablet Take by mouth.    [provider]  senna-docusate (SENOKOT-S) 8.6-50 MG tablet Take 1 tablet by mouth daily.    [provider]  sodium fluoride (PREVIDENT 5000 PLUS) 1.1 % CREA dental cream Place 1 application onto teeth every evening.    [provider]  VIMPAT 200 MG TABS tablet Take 1 tablet (200 mg total) by mouth 2 (two) times daily. 04/09/20   Penumalli, Glenford BayleyVikram R, MD    Allergies    Penicillin g and Penicillins  Review of Systems   Review of Systems   Constitutional: Negative.   HENT: Negative.    Respiratory: Negative.    Cardiovascular: Negative.   Gastrointestinal:  Positive for abdominal pain, nausea and vomiting. Negative for abdominal distention, anal bleeding, blood in stool, constipation, diarrhea and rectal pain.  Genitourinary: Negative.   Musculoskeletal: Negative.   Skin: Negative.   Neurological: Negative.   All other systems reviewed and are negative.  Physical Exam Updated Vital Signs BP 104/68   Pulse 90   Temp 98.5 F (36.9 C) (Oral)   Resp 16   Ht 5\' 9"  (1.753 m)   Wt 72.6 kg   SpO2 99%   BMI 23.63 kg/m   Physical Exam Vitals and nursing note reviewed.  Constitutional:      General: She is not in acute distress.  Appearance: She is well-developed. She is not ill-appearing, toxic-appearing or diaphoretic.  HENT:     Head: Atraumatic.     Nose: Nose normal.     Mouth/Throat:     Mouth: Mucous membranes are moist.  Eyes:     Pupils: Pupils are equal, round, and reactive to light.  Cardiovascular:     Rate and Rhythm: Normal rate.     Pulses: Normal pulses.     Heart sounds: Normal heart sounds.  Pulmonary:     Effort: Pulmonary effort is normal. No respiratory distress.     Breath sounds: Normal breath sounds.  Abdominal:     General: Bowel sounds are normal. There is no distension.     Palpations: Abdomen is soft.     Tenderness: There is generalized abdominal tenderness. There is right CVA tenderness. There is no left CVA tenderness, guarding or rebound.     Hernia: No hernia is present.  Musculoskeletal:        General: Normal range of motion.     Cervical back: Normal range of motion.  Skin:    General: Skin is warm and dry.  Neurological:     General: No focal deficit present.     Mental Status: She is alert.  Psychiatric:        Mood and Affect: Mood normal.    ED Results / Procedures / Treatments   Labs (all labs ordered are listed, but only abnormal results are  displayed) Labs Reviewed  CBC WITH DIFFERENTIAL/PLATELET - Abnormal; Notable for the following components:      Result Value   RBC 3.74 (*)    Hemoglobin 11.6 (*)    HCT 33.7 (*)    All other components within normal limits  COMPREHENSIVE METABOLIC PANEL - Abnormal; Notable for the following components:   Potassium 3.4 (*)    Albumin 3.2 (*)    AST 13 (*)    All other components within normal limits  HCG, QUANTITATIVE, PREGNANCY - Abnormal; Notable for the following components:   hCG, Beta Chain, Quant, S 6 (*)    All other components within normal limits  I-STAT BETA HCG BLOOD, ED ([facility], [facility], [facility] ONLY) - Abnormal; Notable for the following components:   I-stat hCG, quantitative 16.1 (*)    All other components within normal limits  RESP PANEL BY RT-PCR (FLU A&B, COVID) ARPGX2  CULTURE, BLOOD (ROUTINE X 2)  CULTURE, BLOOD (ROUTINE X 2)  LACTIC ACID, PLASMA    EKG None  Radiology No results found.  Procedures Procedures   Medications Ordered in ED Medications  cefTRIAXone (ROCEPHIN) 2 g in sodium chloride 0.9 % 100 mL IVPB (0 g Intravenous Stopped 05/30/21 1251)  ondansetron (ZOFRAN) injection 4 mg (4 mg Intravenous Given 05/30/21 1240)  sodium chloride 0.9 % bolus 500 mL (0 mLs Intravenous Stopped 05/30/21 1448)  morphine 4 MG/ML injection 4 mg (4 mg Intravenous Given 05/30/21 1240)  potassium chloride SA (KLOR-CON) CR tablet 40 mEq (40 mEq Oral Given 05/30/21 1455)   ED Course  I have reviewed the triage vital signs and the nursing notes.  Pertinent labs & imaging results that were available during my care of the patient were reviewed by me and considered in my medical decision making (see chart for details).  Here for evaluation abnormal lab.  50 year old seen here 3 days ago diagnosed with pyelonephritis.  At that time was febrile, tachycardic, had leukocytosis.  Blood cultures positive for E. Coli.  She continues to have  subjective fevers at home as well as intermittent  abdominal pain however improved since prior evaluation.  Persistent nausea without emesis.  Has been compliant with her Abx at home.  Per facility nurse,  has not noted fever on patient. Plan on labs, imaging and reassess.  Labs and imaging personally reviewed and interpreted:  CBC without leukocytosis, hemoglobin 11.69  Metabolic panel potassium 3.4, noticed electrolyte, renal abnormality I-STAT hCG 16.1>> 6, denies chance of pregnancy, post menopausal COVID, flu negative Lactic acid 1.6  Patient reassessed.  Pain controlled.  I have reviewed sensitivities from UA, sensitive to Rocephin.  Given Rocephin, fluid bolus here. Sensitivities to [facility] not resulted yet.  CONSULT with Dr. Ronaldo Miyamoto who saw patient in consult however rec consults with ID to see if patient needs admission or can dc home with PO abx given improvement in labs  CONSULT with Dr. Renold Don ID who reviewed patients chart>>recommends total 10 day course of PO Keflex and close monitoring of symptoms. Can dc home on PO abx.  Discussed rec from ID with nurse at patient's facility.  They are agreeable to accept patient back. Tolerating PO intake here without difficulty. She was given prescription for Keflex as she has tolerated cephalosporins in the past as well as Zofran in the past. Will need recheck of potassium, supplemented here.  Patient is nontoxic, nonseptic appearing, in no apparent distress.  Patient's pain and other symptoms adequately managed in emergency department.  Fluid bolus given.  Labs, imaging and vitals reviewed.  Patient does not meet the SIRS or Sepsis criteria.  On repeat exam patient does not have a surgical abdomin and there are no peritoneal signs.  No indication of appendicitis, bowel obstruction, bowel perforation, cholecystitis, diverticulitis, intraabdominal abscess, PID or ectopic pregnancy.    The patient has been appropriately medically screened and/or stabilized in the ED. I have low suspicion for any other  emergent medical condition which would require further screening, evaluation or treatment in the ED or require inpatient management.  Patient is hemodynamically stable and in no acute distress.  Patient able to ambulate in department prior to ED.  Evaluation does not show acute pathology that would require ongoing or additional emergent interventions while in the emergency department or further inpatient treatment.  I have discussed the diagnosis with the patient and answered all questions.  Pain is been managed while in the emergency department and patient has no further complaints prior to discharge.  Patient is comfortable with plan discussed in room and is stable for discharge at this time.  I have discussed strict return precautions for returning to the emergency department.  Patient was encouraged to follow-up with PCP/specialist refer to at discharge.        MDM Rules/Calculators/A&P                            Final Clinical Impression(s) / ED Diagnoses Final diagnoses:  Positive blood cultures  Pyelonephritis    Rx / DC Orders ED Discharge Orders          Ordered    cephALEXin (KEFLEX) 500 MG capsule  4 times daily        05/30/21 1440    ondansetron (ZOFRAN ODT) 4 MG disintegrating tablet  Every 8 hours PRN        05/30/21 1440             Minyon Billiter A, PA-C 05/30/21 1512    Pollyann Savoy, MD  06/02/21 1014  

## 2021-05-30 NOTE — Consult Note (Signed)
Medical Consultation  Katherine Wolf BJS:[id number] DOB: May 12, 1971 DOA: 05/30/2021 PCP: Lucretia Field   Requesting physician: Dr. Bernette Mayers Date of consultation: 05/30/21 Reason for consultation: Positive blood cultures  Impression/Recommendations E coli bacteremia Pyelonephritis w/ e coli as causative organism     - recent ED visit for pyelonephritis. Sent home on keflex. Returns to ED d/t positive blood cultures, nausea     - her lab work is all improved from 3 days ago w/ WBC going from 20 down to 6; her vitals are stable, there are no fevers noted     - can treat e coli bacteremia w/ PO abx for 7 - 10 days; EDP getting ID recs specific for her infection  Nausea Abdominal pain     - her abdominal exam is benign and she is requesting a cheeseburger now     - PRN zofran, daily protonix   Hypokalemia     - PO K+ x 1; follow up outpt  She is stable for this evaluation and would not warrant hospital admission at this point. Agree with return to facility.   Chief Complaint: Positive blood cultures  HPI:  Katherine Wolf is a 50 y.o. female with medical history significant of cognitive deficit, cerebral palsy. Presenting with positive blood cultures. She is unable to provided a detailed history. History from chart review. She was recently in the ED for pyelonephritis. She was evaluated. Her urine culture at that time was positive for pan-sensitive e coli. She was discharged on keflex. During her evaluation, blood cultures were collected. Those cultures have returned positive for e coli w/ sensitivities pending. During the time she has been back at her facility, she has had subjective chills. No recorded fevers. She reports nausea and abdominal pain. Upon her return to the ED, lab work was collected. Her WBC has improved from 20 down to 6. Her K+ was slightly low. Her renal function looked good. TRH was called for evaluation.     Review of Systems:  She is a poor historian. Inconsistent  answers on ROS.   Past Medical History:  Diagnosis Date   Anxiety    Cerebral palsy (HCC)    Depression    Hip fracture (HCC)    Intellectual disability    Osteoporosis    Schizophrenia (HCC)    Seizures (HCC)    History reviewed. No pertinent surgical history. Social History:  reports that she has never smoked. She has never used smokeless tobacco. She reports that she does not drink alcohol and does not use drugs.  Allergies  Allergen Reactions   Penicillin G     Other reaction(s): Other unknown   Penicillins     On Mar- unsure of other information   History reviewed. No pertinent family history.  Prior to Admission medications   Medication Sig Start Date End Date Taking? Authorizing Provider  acetaminophen (TYLENOL) 325 MG tablet Take 650 mg by mouth at bedtime.     [provider]  Calcium Carb-Cholecalciferol (CALCIUM+D3) 600-800 MG-UNIT TABS Take 1 tablet by mouth daily.    [provider]  Calcium Carb-Cholecalciferol 500-600 MG-UNIT TABS Take by mouth.    [provider]  cephALEXin (KEFLEX) 500 MG capsule Take 1 capsule (500 mg total) by mouth 4 (four) times daily. 05/27/21   Lorre Nick, MD  cholecalciferol (VITAMIN D3) 25 MCG (1000 UT) tablet Take 1,000 Units by mouth daily.    [provider]  Cholecalciferol 25 MCG (1000 UT) tablet Take by mouth.  [provider]  citalopram (CELEXA) 20 MG tablet Take by mouth.    [provider]  diazePAM (VALTOCO 15 MG DOSE NA) Place 1 spray into both nostrils as needed (seizure greater than or equal to 3 mins).    [provider]  DIAZEPAM PO Place into the nose.    [provider]  divalproex (DEPAKOTE) 250 MG DR tablet Take 3 tablets (750 mg total) by mouth 2 (two) times daily. 04/09/20 07/03/21  Penumalli, Glenford Bayley, MD  divalproex (DEPAKOTE) 250 MG DR tablet Take by mouth. 04/09/20   [provider]  Docusate Sodium 150 MG/15ML syrup Take by  mouth.    [provider]  hydrOXYzine (ATARAX/VISTARIL) 50 MG tablet Take by mouth.    [provider]  hydrOXYzine (VISTARIL) 50 MG capsule Take 50 mg by mouth 3 (three) times daily.     [provider]  melatonin 3 MG TABS tablet Take by mouth.    [provider]  mirabegron ER (MYRBETRIQ) 50 MG TB24 tablet Take 50 mg by mouth daily.    [provider]  nitrofurantoin (MACRODANTIN) 100 MG capsule Take 100 mg by mouth 4 (four) times daily. 03/26/21   [provider]  OLANZapine (ZYPREXA) 2.5 MG tablet every night.    [provider]  Olopatadine HCl 0.2 % SOLN Place 1 drop into both eyes every morning.     [provider]  Olopatadine HCl 0.2 % SOLN 1 drop 1 (one) time each day.    [provider]  pantoprazole (PROTONIX) 20 MG tablet Take 20 mg by mouth daily.    [provider]  pantoprazole (PROTONIX) 20 MG tablet Take by mouth.    [provider]  polyethylene glycol powder (GLYCOLAX/MIRALAX) 17 GM/SCOOP powder Take by mouth.    [provider]  promethazine (PHENERGAN) 25 MG suppository Place 25 mg rectally every 4 (four) hours as needed for vomiting.     [provider]  promethazine (PHENERGAN) 25 MG suppository Place rectally.    [provider]  promethazine (PHENERGAN) 25 MG tablet Take 25 mg by mouth every 4 (four) hours as needed for vomiting.     [provider]  promethazine (PHENERGAN) 25 MG tablet Take by mouth.    [provider]  senna-docusate (SENOKOT-S) 8.6-50 MG tablet Take 1 tablet by mouth daily.    [provider]  sodium fluoride (PREVIDENT 5000 PLUS) 1.1 % CREA dental cream Place 1 application onto teeth every evening.    [provider]  VIMPAT 200 MG TABS tablet Take 1 tablet (200 mg total) by mouth 2 (two) times daily. 04/09/20   Penumalli, Glenford Bayley, MD   Physical Exam: Blood pressure 101/69, pulse 85,  temperature 98.5 F (36.9 C), temperature source Oral, resp. rate 15, height 5\' 9"  (1.753 m), weight 72.6 kg, SpO2 92 %. Vitals:   05/30/21 1330 05/30/21 1400  BP: 109/80 101/69  Pulse: 85   Resp: 17 15  Temp:    SpO2: 99% 92%    General: 50 y.o. female resting in bed in NAD Eyes: PERRL, normal sclera ENMT: Nares patent w/o discharge, orophaynx clear, dentition normal, ears w/o discharge/lesions/ulcers Neck: Supple, trachea midline Cardiovascular: RRR, +S1, S2, no m/g/r, equal pulses throughout Respiratory: CTABL, no w/r/r, normal WOB GI: BS+, ND, mild TTP to epigastrum, no masses noted, no organomegaly noted MSK: No e/c/c Skin: No rashes, bruises, ulcerations noted Neuro: A&O x name, no focal deficits Psyc: Calm/cooperative  Labs on Admission:  Basic Metabolic Panel: Recent Labs  Lab 05/27/21 1508 05/30/21 1137  NA 131* 138  K 3.5 3.4*  CL 96* 100  CO2 25 27  GLUCOSE 103* 95  BUN 11 10  CREATININE 0.49 0.50  CALCIUM 8.8* 9.4   Liver Function Tests: Recent Labs  Lab 05/27/21 1508 05/30/21 1137  AST 12* 13*  ALT 9 10  ALKPHOS 61 59  BILITOT 0.5 0.4  PROT 7.6 8.1  ALBUMIN 3.3* 3.2*   No results for input(s): LIPASE, AMYLASE in the last 168 hours. No results for input(s): AMMONIA in the last 168 hours. CBC: Recent Labs  Lab 05/27/21 1508 05/30/21 1137  WBC 20.0* 6.1  NEUTROABS 14.4* 3.7  HGB 11.4* 11.6*  HCT 32.8* 33.7*  MCV 91.1 90.1  PLT 152 259   Cardiac Enzymes: No results for input(s): CKTOTAL, CKMB, CKMBINDEX, TROPONINI in the last 168 hours. BNP: Invalid input(s): POCBNP CBG: No results for input(s): GLUCAP in the last 168 hours.  Radiological Exams on Admission: No results found.  EKG: None obtained in ED.  Time spent: 25 minutes  Katherine Wolf A Ronaldo Miyamoto DO [facility]  If 7PM-7AM, please contact night-coverage [email or web address] 05/30/2021, 2:38 PM

## 2021-05-30 NOTE — ED Triage Notes (Signed)
Patient is from a group home-[facility]. Patient was recently hospitalized for pyelonephritis and was discharged.  The group[ home received a call today stating the patient had positive blood cultures.  Patient c/o abdominal pain, back pain, and nausea.

## 2021-05-31 ENCOUNTER — Telehealth: Payer: Self-pay | Admitting: Emergency Medicine

## 2021-05-31 LAB — CULTURE, BLOOD (ROUTINE X 2): Special Requests: ADEQUATE

## 2021-05-31 NOTE — Telephone Encounter (Signed)
Post ED Visit - Positive Culture Follow-up  Culture report reviewed by antimicrobial stewardship pharmacist: [facility] []  Nathan Batchelder, Pharm.D. []  , .D., BCPS AQ-ID []  Celedonio Miyamoto, Pharm.D., BCPS []  [address], .D., BCPS []  [place], .D., BCPS, AAHIVP []  Georgina Pillion, Pharm.D., BCPS, AAHIVP []  [address], PharmD, BCPS []  , PharmD, BCPS []  [place], PharmD, BCPS []  [address], PharmD []  , PharmD, BCPS []  Estella Husk, PharmD  Pharmacy Team []  Lysle Pearl, PharmD []  , PharmD []  Phillips Climes, PharmD []  , Rph []  Agapito Games) , PharmD []  Verlan Friends, PharmD []  , PharmD []  Mervyn Gay, PharmD []  , PharmD []  Vinnie Level, PharmD []  [facility], PharmD []  , PharmD [x]  Len Childs, PharmD   Positive urine culture Treated with Cephalexin, organism sensitive to the same and no further patient follow-up is required at this time.  05/31/2021, 3:56 PM

## 2021-06-01 LAB — CULTURE, BLOOD (ROUTINE X 2)
Culture: NO GROWTH
Special Requests: ADEQUATE

## 2021-06-04 LAB — CULTURE, BLOOD (ROUTINE X 2)
Culture: NO GROWTH
Culture: NO GROWTH

## 2021-06-06 IMAGING — CT CT HEAD W/O CM
3 of 4 series · 14 of 47 positions shown, 16 images · non-contrast
Comparison: 06/17/2019

CLINICAL DATA: Headache, neck pain, facial ecchymosis

EXAM:
CT HEAD WITHOUT CONTRAST
CT MAXILLOFACIAL WITHOUT CONTRAST
CT CERVICAL SPINE WITHOUT CONTRAST
TECHNIQUE: Multidetector CT imaging of the head, cervical spine, and
maxillofacial structures were performed using the standard protocol
without intravenous contrast. Multiplanar CT image reconstructions
of the cervical spine and maxillofacial structures were also
generated.

[Series 3: head wo · axial · 0.48mm/px · z∈[-79,+56]mm · 8 of 35 slices shown, 10 images]
[im 4/35  brain]
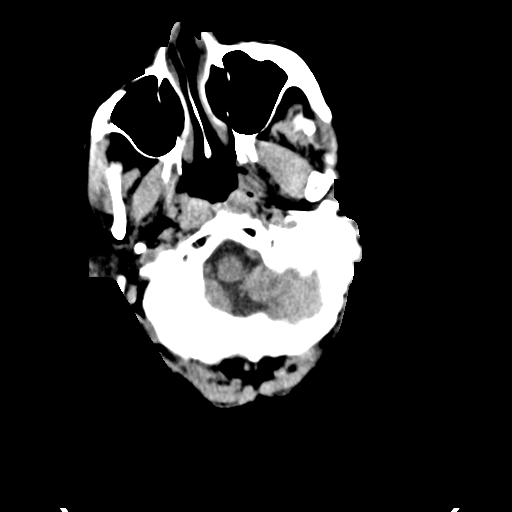
[im 4/35  bone]
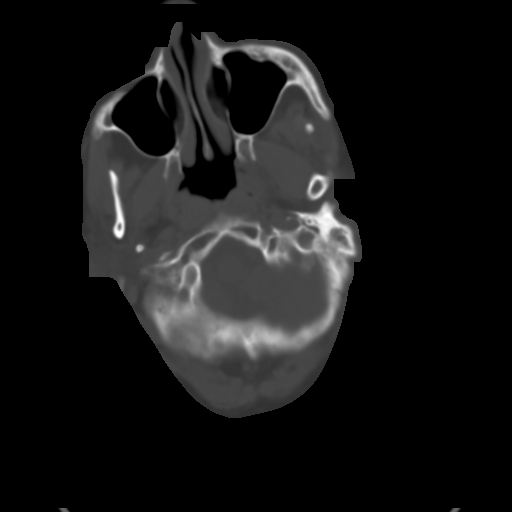
[im 7/35  brain]
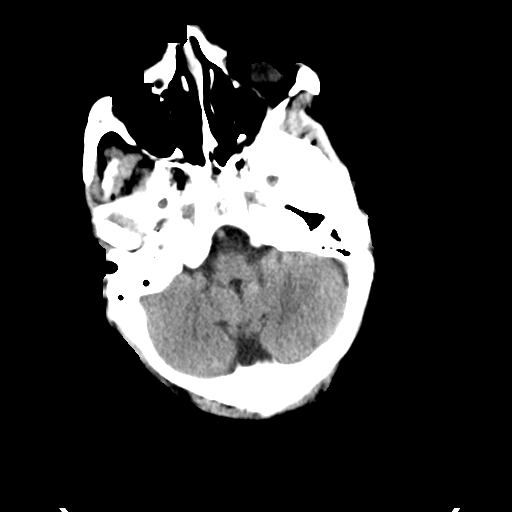
[im 11/35  brain]
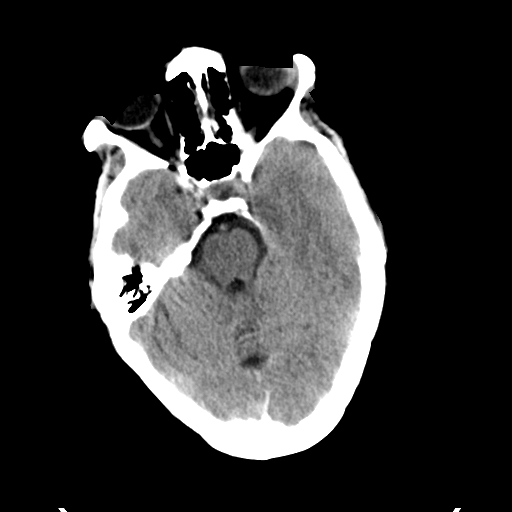
[im 14/35  brain]
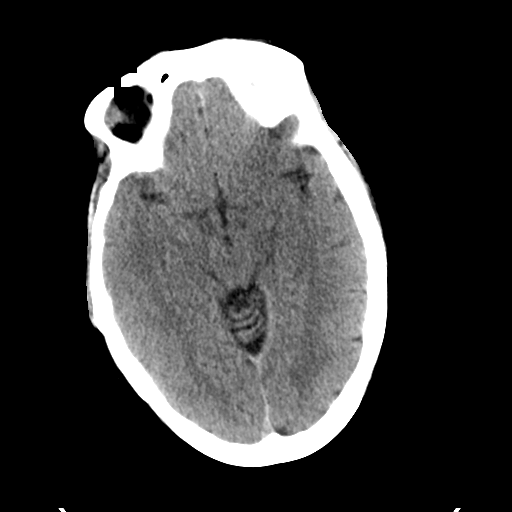
[im 21/35  brain]
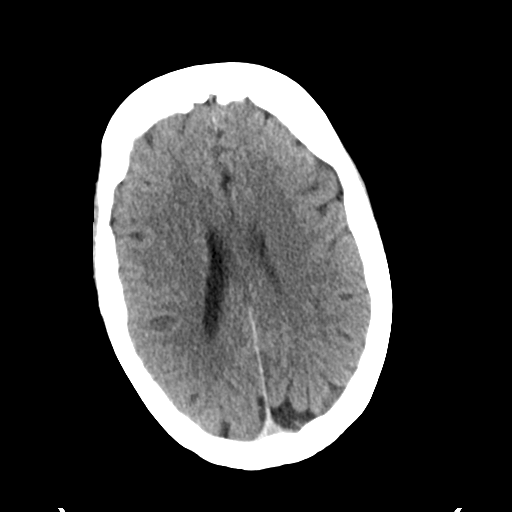
[im 21/35  bone]
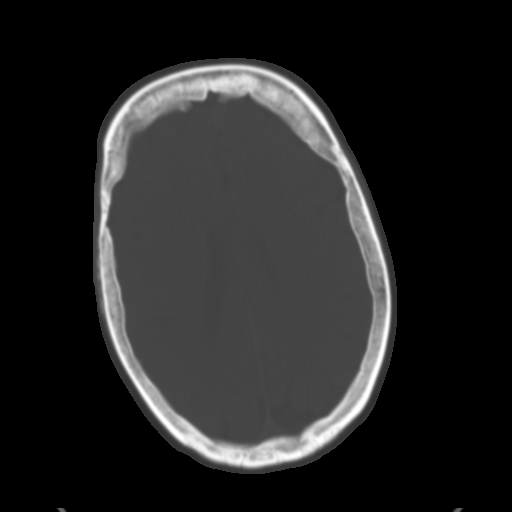
[im 24/35  brain]
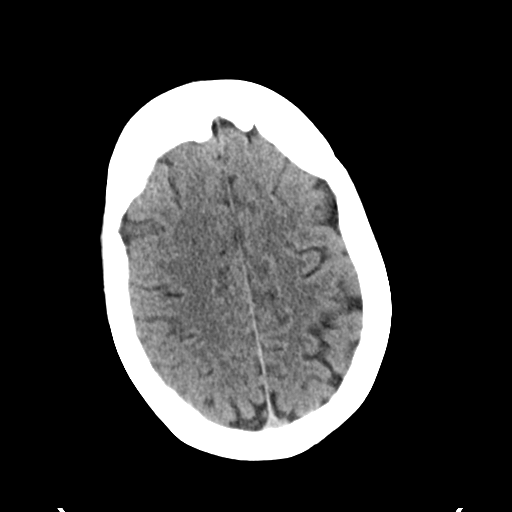
[im 28/35  brain]
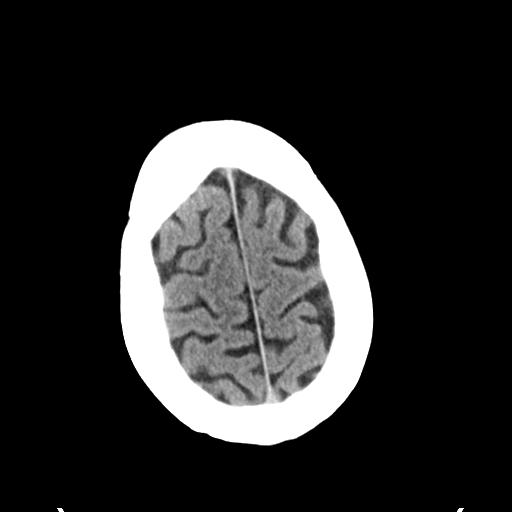
[im 31/35  brain]
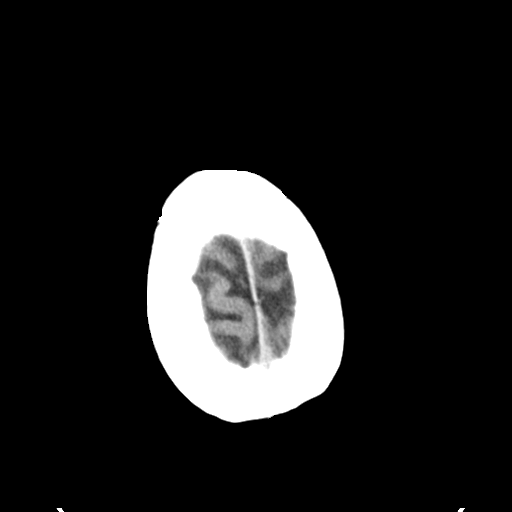

[Series 6: coronal soft tissue · coronal · 0.41mm/px · 3 of 70 slices shown]
[im 24/70  brain]
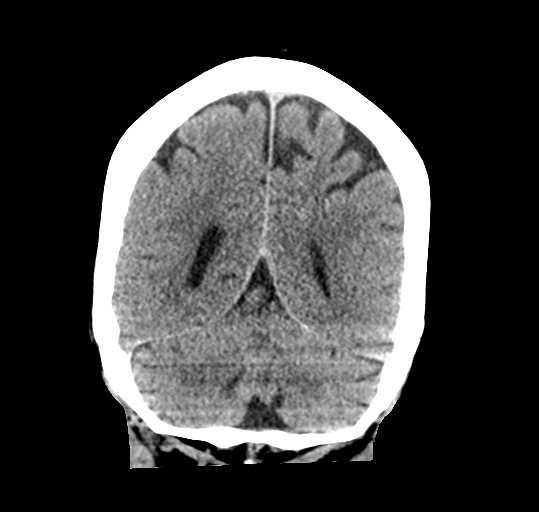
[im 31/70  brain]
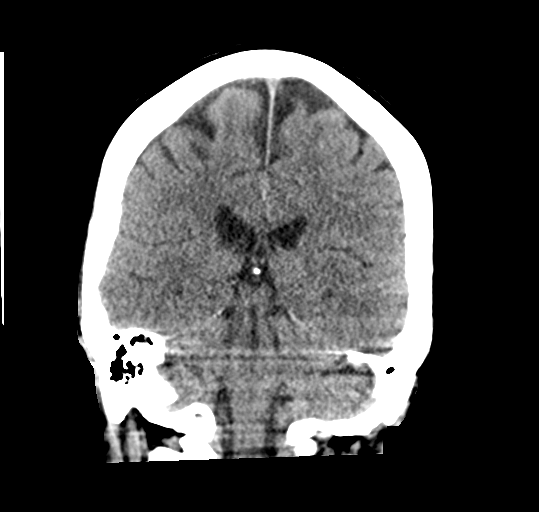
[im 39/70  brain]
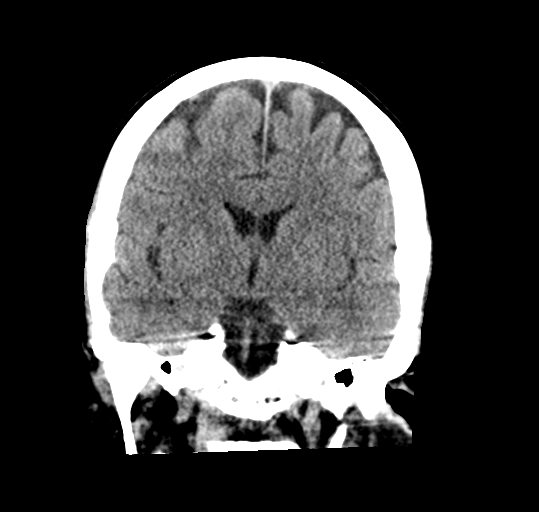

[Series 7: sagittal soft tissue · sagittal · 0.39mm/px · 3 of 49 slices shown]
[im 17/49  brain]
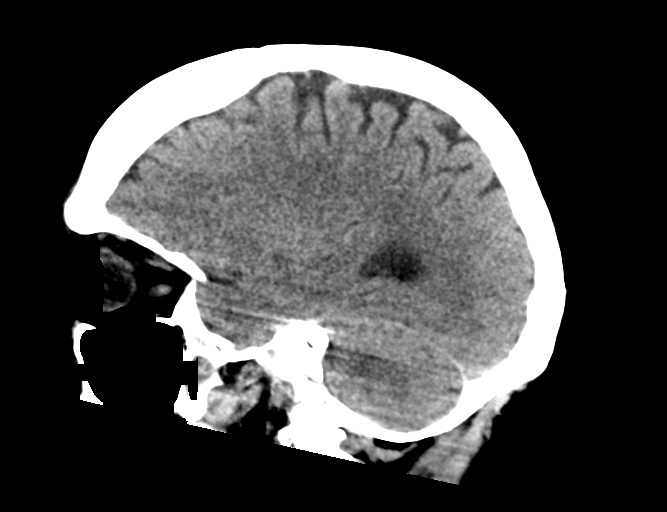
[im 25/49  brain]
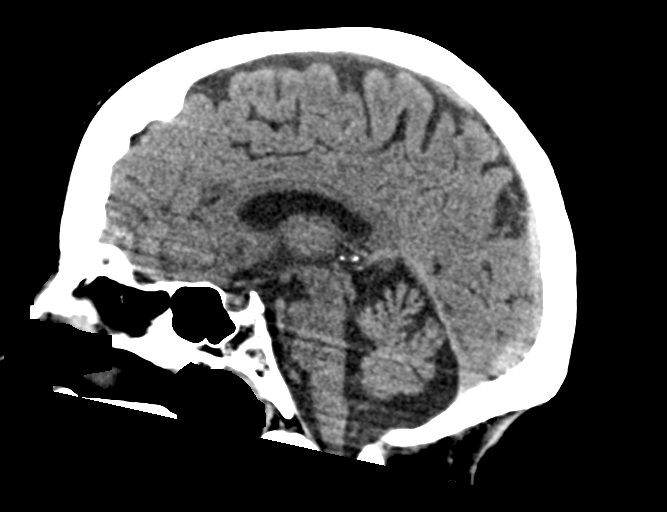
[im 33/49  brain]
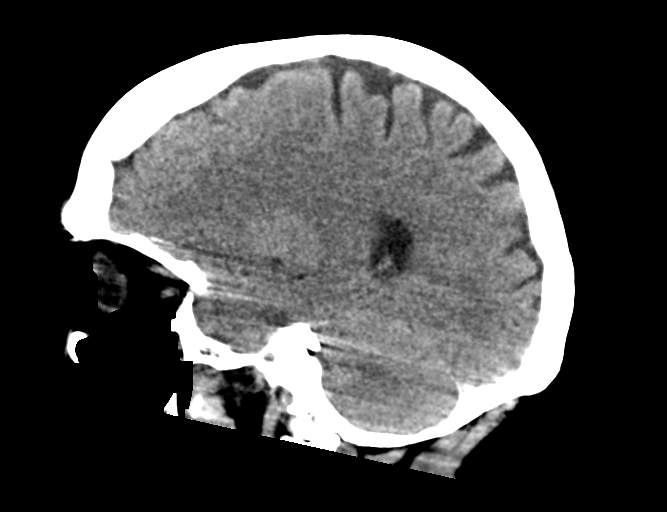

[14 of 47 positions shown; findings below may reference images not displayed]

FINDINGS: CT HEAD FINDINGS

Brain: No evidence of acute infarction, hemorrhage, hydrocephalus,
extra-axial collection or mass lesion/mass effect.

Vascular: No hyperdense vessel or unexpected calcification.

Skull: Normal. Negative for fracture or focal lesion.

Other: None.

CT MAXILLOFACIAL FINDINGS

Osseous: Increased comminution of minimally depressed bilateral
nasal bone fractures compared to 06/17/2019 with slight increase in
the degree of depression of the left nasal bone fracture. Unchanged
appearance of minimally displaced fracture of the superior nasal
septum compared to prior. Orbital walls intact. Mandible intact
without fracture or dislocation.

Orbits: Negative. No traumatic or inflammatory finding.

Sinuses: Clear.

Soft tissues: Mild soft tissue swelling over the nasal area.

CT CERVICAL SPINE FINDINGS

Alignment: Minimal grade 1 anterolisthesis C3 on C4 is unchanged
from prior. Facet alignment is maintained.

Skull base and vertebrae: No acute fracture. No primary bone lesion
or focal pathologic process.

Soft tissues and spinal canal: No prevertebral fluid or swelling. No
visible canal hematoma.

Disc levels: Intervertebral disc spaces are maintained. There is
focal advanced facet arthrosis at C3-4 on the left.

Upper chest: Negative.

Other: None.
IMPRESSION: 1. No acute intracranial findings.
2. Increased comminution and slight increased depression of
bilateral nasal bone fractures compared to prior CT 06/17/2019
suggesting acute on subacute fracture.
3. Unchanged appearance of minimally displaced superior nasal septum
fracture.
4. No acute fracture or dislocation of the cervical spine.
5. Focal left-sided facet arthropathy at C3-4.

## 2021-06-09 ENCOUNTER — Encounter: Payer: Self-pay | Admitting: Diagnostic Neuroimaging

## 2021-06-09 ENCOUNTER — Ambulatory Visit (INDEPENDENT_AMBULATORY_CARE_PROVIDER_SITE_OTHER): Payer: Medicare Other | Admitting: Diagnostic Neuroimaging

## 2021-06-09 ENCOUNTER — Other Ambulatory Visit: Payer: Self-pay

## 2021-06-09 VITALS — BP 104/70 | HR 93

## 2021-06-09 DIAGNOSIS — G808 Other cerebral palsy: Secondary | ICD-10-CM | POA: Diagnosis not present

## 2021-06-09 DIAGNOSIS — R6250 Unspecified lack of expected normal physiological development in childhood: Secondary | ICD-10-CM | POA: Diagnosis not present

## 2021-06-09 DIAGNOSIS — G40909 Epilepsy, unspecified, not intractable, without status epilepticus: Secondary | ICD-10-CM | POA: Diagnosis not present

## 2021-06-09 MED ORDER — DIVALPROEX SODIUM 250 MG PO DR TAB: 750.0000 mg | DELAYED_RELEASE_TABLET | Freq: Two times a day (BID) | ORAL | 12 refills | Status: DC

## 2021-06-09 NOTE — Patient Instructions (Addendum)
SEIZURE DISORDER (2-6 seizures per month) - continue vimpat 200mg  twice a day - increase divalproex to 750mg  twice a day (currently 625/500) - repeat depakote level in 2 weeks

## 2021-06-09 NOTE — Progress Notes (Signed)
Chief Complaint  Patient presents with   Follow-up    Rm 6 with Katherine Wolf from assisted living. Reports some seizure activity since last visit. Log of seizures brought to visit today.    History of Present Illness:  UPDATE (06/09/21, VRP): Since last visit, continues with seizures (staring, unresponsive; no convulsions/ Avg 2-6 sz spells per month. Now on divalproex 625 / 500; not clear when dose was reduced (was supposed to be increased to 750mg  twice a day at last visit).  UPDATE (04/09/20, VRP): Since last visit, had 4 seizures since last visit. Otherwise symptoms stable. No alleviating or aggravating factors. Tolerating meds.    UPDATE (11/07/19, VRP):  - since last visit, doing well; no seizures; no more falls; using wheelchair, gait belt, 24 hour supervision - tolerating meds; doing well - BP stable  UPDATE (07/31/19 A Lomax): Katherine Wolf is a 50 y.o. female here today for follow up for seizure and recurrent falls.  She continues divalproex 500 mg twice daily as well as Vimpat 200 mg twice daily. She was last seen about 4 weeks ago and concerns were present for orthostatic blood pressures. She was advised to see PCP to review need for metoprolol 25mg  BID.  No medications changes were made.  Patient presents with her caregiver today who states that she is doing much better.  No seizure activity or fall since last being seen 9/16.  She is tolerating medications well without obvious adverse effects.  She is being assessed by physical therapy at [facility].  She reports feeling well today and without concerns.   UPDATE (06/28/2019 A Lomax): Katherine Wolf is a 50 y.o. female with CP here today for follow up of seizure and multiple falls. [facility] is a [facility] at [facility]. Caregiver with her today has limited information but aids in history.  Caregiver reports that she has had approx 7 falls in the past month. She has also had multiple witnessed seizures, some requiring ER eval (11/2018  and 06/2019). With each evaluation divalproex was found to be subtherapeutic. On 9/5 divalproex was increased to 500mg  twice daily.  ER evaluation yesterday shows valproic acid level of 76. Caregiver thinks that she may have had "a mild seizure" about 4 days ago but uncertain of details. She was told that Katherine Wolf was standing at the sink in the kitchen and staff heard a loud noise. When they saw her she had fallen to the floor. When they got to her they noticed her eyes were rolling. On call provider called and decision was made to observe her. No seizure activity noted since.    She was started on divalproex 500mg  BID in 04/2018 by PCP. At follow up, 2 weeks later, dose was decreased to 250mg  BID due to concerns of "movement disorder and lethargy."   She is in a room with 5 other patients. She usually walks without assistance. She has not required assistive devices. She needs very minimal assistance with bathing and dressing. Recently, they have started using gait belt and walker or wheelchair for long distances. BP is not routinely checked. Today readings are 85/63 (at beginning of visit) and 108/60 (with standing). Patient does report feeling dizzy with standing, however, caregiver states that she will repeat words said to her. She is taking metoprolol 25mg  twice daily prescribed by PCP.    PRIOR HPI (10/31/18, VRP): 50 year old female with developmental delay, here for evaluation of seizure disorder.  Patient arrives with transporter.  Patient and transporter do  not have any additional clinical information.  I reviewed referring records in chart.  Last seizure June 2018.  Patient is stable on Vimpat and Depakote.  No triggering or aggravating factors.    Outpatient Encounter Medications as of 06/09/2021  Medication Sig   acetaminophen (TYLENOL) 325 MG tablet Take 650 mg by mouth at bedtime.    Calcium Carb-Cholecalciferol (CALCIUM+D3) 600-800 MG-UNIT TABS Take 1 tablet by mouth daily.   Calcium  Carb-Cholecalciferol 500-600 MG-UNIT TABS Take by mouth.   cholecalciferol (VITAMIN D3) 25 MCG (1000 UT) tablet Take 1,000 Units by mouth daily.   Cholecalciferol 25 MCG (1000 UT) tablet Take by mouth.   citalopram (CELEXA) 20 MG tablet Take by mouth.   diazePAM (VALTOCO 15 MG DOSE NA) Place 1 spray into both nostrils as needed (seizure greater than or equal to 3 mins).   DIAZEPAM PO Place into the nose.   divalproex (DEPAKOTE) 250 MG DR tablet Take 3 tablets (750 mg total) by mouth 2 (two) times daily.   Docusate Sodium 150 MG/15ML syrup Take by mouth.   hydrOXYzine (ATARAX/VISTARIL) 50 MG tablet Take by mouth.   hydrOXYzine (VISTARIL) 50 MG capsule Take 50 mg by mouth 3 (three) times daily.    melatonin 3 MG TABS tablet Take by mouth.   mirabegron ER (MYRBETRIQ) 50 MG TB24 tablet Take 50 mg by mouth daily.   nitrofurantoin (MACRODANTIN) 100 MG capsule Take 100 mg by mouth 4 (four) times daily.   OLANZapine (ZYPREXA) 2.5 MG tablet every night.   Olopatadine HCl 0.2 % SOLN Place 1 drop into both eyes every morning.    Olopatadine HCl 0.2 % SOLN 1 drop 1 (one) time each day.   ondansetron (ZOFRAN ODT) 4 MG disintegrating tablet Take 1 tablet (4 mg total) by mouth every 8 (eight) hours as needed for nausea or vomiting.   pantoprazole (PROTONIX) 20 MG tablet Take by mouth.   polyethylene glycol powder (GLYCOLAX/MIRALAX) 17 GM/SCOOP powder Take by mouth.   promethazine (PHENERGAN) 25 MG suppository Place 25 mg rectally every 4 (four) hours as needed for vomiting.    promethazine (PHENERGAN) 25 MG suppository Place rectally.   promethazine (PHENERGAN) 25 MG tablet Take 25 mg by mouth every 4 (four) hours as needed for vomiting.    promethazine (PHENERGAN) 25 MG tablet Take by mouth.   senna-docusate (SENOKOT-S) 8.6-50 MG tablet Take 1 tablet by mouth daily.   sodium fluoride (PREVIDENT 5000 PLUS) 1.1 % CREA dental cream Place 1 application onto teeth every evening.   VIMPAT 200 MG TABS tablet  Take 1 tablet (200 mg total) by mouth 2 (two) times daily.   [DISCONTINUED] divalproex (DEPAKOTE) 125 MG DR tablet Take 625 mg by mouth every morning.   [DISCONTINUED] divalproex (DEPAKOTE) 250 MG DR tablet Take 3 tablets (750 mg total) by mouth 2 (two) times daily.   [DISCONTINUED] divalproex (DEPAKOTE) 250 MG DR tablet Take by mouth.   [DISCONTINUED] divalproex (DEPAKOTE) 500 MG DR tablet Take 500 mg by mouth at bedtime.   pantoprazole (PROTONIX) 20 MG tablet Take 20 mg by mouth daily. (Patient not taking: Reported on 06/09/2021)   No facility-administered encounter medications on file as of 06/09/2021.       Observations/Objective:  GENERAL EXAM/CONSTITUTIONAL: Vitals:  Vitals:   06/09/21 1317  BP: 104/70  Pulse: 93   There is no height or weight on file to calculate BMI. Wt Readings from Last 3 Encounters:  05/30/21 160 lb (72.6 kg)  05/27/21 160 lb (72.6 kg)  04/09/20 159 lb 12.8 oz (72.5 kg)   Patient is in no distress; well developed, nourished and groomed; neck is supple  CARDIOVASCULAR: Examination of carotid arteries is normal; no carotid bruits Regular rate and rhythm, no murmurs Examination of peripheral vascular system by observation and palpation is normal  EYES: Ophthalmoscopic exam of optic discs and posterior segments is normal; no papilledema or hemorrhages No results found.  MUSCULOSKELETAL: Gait, strength, tone, movements noted in Neurologic exam below  NEUROLOGIC: MENTAL STATUS:  No flowsheet data found. DECR MEMORY  DECR attention and concentration DECR FLUENCY; FOLLOWS SIMPLE COMMANDS DECR fund of knowledge PLEASANT, SMILING  CRANIAL NERVE:  2nd - no papilledema on fundoscopic exam 2nd, 3rd, 4th, 6th - pupils equal and reactive to light, visual fields full to confrontation, extraocular muscles intact, no nystagmus 5th - facial sensation symmetric 7th - facial strength symmetric 8th - hearing intact 9th - palate elevates symmetrically,  uvula midline 11th - shoulder shrug symmetric 12th - tongue protrusion midline  MOTOR:  SLOW MOVEMENTS; DIFFUSE 4/5 STRENGTH  SENSORY:  normal and symmetric to light touch  COORDINATION:  finger-nose-finger, fine finger movements SLOW  REFLEXES:  deep tendon reflexes present and symmetric  GAIT/STATION:  USING WALKER   Lab Results  Component Value Date   VALPROATE 73 05/27/2021    Assessment and Plan:  Dx:  1. Seizure disorder (HCC)   2. Developmental delay   3. Other cerebral palsy (HCC)       SEIZURE DISORDER (2-6 seizures per month) - continue vimpat 200mg  twice a day - increase divalproex to 750mg  twice a day (currently 625/500) - repeat depakote level in 2 weeks  Meds ordered this encounter  Medications   divalproex (DEPAKOTE) 250 MG DR tablet    Sig: Take 3 tablets (750 mg total) by mouth 2 (two) times daily.    Dispense:  180 tablet    Refill:  12    Follow Up Instructions:  - Return in about 1 year (around 06/09/2022).    , MD 06/09/2021, 1:58 PM Certified in Neurology, Neurophysiology and Neuroimaging  [facility] [address] [place], [address] [place] [phone]

## 2021-07-08 ENCOUNTER — Telehealth: Payer: Self-pay | Admitting: Diagnostic Neuroimaging

## 2021-07-08 NOTE — Telephone Encounter (Signed)
[facility]) sent labs over last week. Recommendation on what to do about her lab levels being too high. Should there be another scheduled. Would like a call from the nurse.

## 2021-07-08 NOTE — Telephone Encounter (Signed)
Received valproic acid lab result. Per Dr Marjory Lies will need to call facility tomorrow to see how patient is doing, find out what dose she is currently taking.  His recommendation is to reduce dose from 750 mg twice daily to 625 mg twice daily unless she is already on this dose.

## 2021-07-08 NOTE — Telephone Encounter (Signed)
I called Shaniqwa back and advised I could not locate the labs faxed last week. She sts last week when the labs were sent error in receipt was received a couple of times.

## 2021-07-09 MED ORDER — DIVALPROEX SODIUM 250 MG PO DR TAB: DELAYED_RELEASE_TABLET | ORAL | 12 refills | Status: AC

## 2021-07-09 NOTE — Addendum Note (Signed)
Addended by: Ann Maki on: 07/09/2021 08:48 AM   Modules accepted: Orders

## 2021-07-09 NOTE — Telephone Encounter (Signed)
I called [facility] service back and spoke with Shaniqwa. She sts at the time of the blood draw pt was taking 750 mg of divalproex bid.   Per VO from Dr. Marjory Lies, ok to decrease to 625 mg bid. I have formulated order for Dr. Marjory Lies to sign and will fax to # [phone] once he has signed the order.

## 2021-07-09 NOTE — Telephone Encounter (Signed)
Order signed and faxed. Confirmation received.  For record Dr. Marjory Lies states oral solution or tablet can be utilized at the facility for the pt. I have advised Shaniqwa of this on the order sheet.  Also for record, Valproic Acid on 06/30/2021 was 110 ug/ml. Reference range is 50-100 ug/mL

## 2021-12-16 ENCOUNTER — Encounter: Payer: Self-pay | Admitting: Diagnostic Neuroimaging

## 2022-04-06 ENCOUNTER — Encounter: Payer: Self-pay | Admitting: *Deleted

## 2022-04-07 ENCOUNTER — Ambulatory Visit (INDEPENDENT_AMBULATORY_CARE_PROVIDER_SITE_OTHER): Payer: Medicare Other | Admitting: Diagnostic Neuroimaging

## 2022-04-07 ENCOUNTER — Encounter: Payer: Self-pay | Admitting: Diagnostic Neuroimaging

## 2022-04-07 VITALS — BP 102/72 | HR 86

## 2022-04-07 DIAGNOSIS — G40909 Epilepsy, unspecified, not intractable, without status epilepticus: Secondary | ICD-10-CM

## 2022-04-07 DIAGNOSIS — R6250 Unspecified lack of expected normal physiological development in childhood: Secondary | ICD-10-CM

## 2022-04-07 DIAGNOSIS — G808 Other cerebral palsy: Secondary | ICD-10-CM | POA: Diagnosis not present

## 2022-04-27 ENCOUNTER — Telehealth: Payer: Self-pay | Admitting: *Deleted

## 2022-04-27 NOTE — Telephone Encounter (Signed)
Received fax from Dr Lynnell Grain, [facility] re: consider changing depakote Rx since Depakote can cause tremors. Placed on MD desk.

## 2022-04-28 NOTE — Telephone Encounter (Signed)
Per Dr Marjory Lies, no med changes at this time.

## 2022-06-09 ENCOUNTER — Ambulatory Visit: Payer: Medicare Other | Admitting: Diagnostic Neuroimaging

## 2022-06-10 ENCOUNTER — Ambulatory Visit: Payer: Medicare Other | Admitting: Diagnostic Neuroimaging

## 2022-10-15 ENCOUNTER — Institutional Professional Consult (permissible substitution): Payer: Medicare Other | Admitting: Nurse Practitioner

## 2022-11-26 ENCOUNTER — Ambulatory Visit (INDEPENDENT_AMBULATORY_CARE_PROVIDER_SITE_OTHER): Payer: Medicare Other | Admitting: Adult Health

## 2022-11-26 ENCOUNTER — Encounter: Payer: Self-pay | Admitting: Adult Health

## 2022-11-26 VITALS — BP 110/70 | HR 97 | Temp 98.2°F

## 2022-11-26 DIAGNOSIS — R0683 Snoring: Secondary | ICD-10-CM

## 2022-11-26 NOTE — Addendum Note (Signed)
Addended by: Vanessa Barbara on: 11/26/2022 12:45 PM   Modules accepted: Orders

## 2022-11-26 NOTE — Assessment & Plan Note (Addendum)
Snoring, gasping for air during sleep, restless sleep questionable underlying sleep apnea.  Previous sleep study in 2019 was negative for sleep apnea.  Patient is on multiple medications for mental health and seizure disorder.  Advised to use caution with sedating medications.  Can repeat in lab sleep study to evaluate for possible underlying sleep apnea.  Unfortunately, have limited medical history information for today's visit.  If repeat sleep study is negative.  Unsure if patient would be able to wear oral appliance for snoring. Attempted to call legal guardian listed on patient's chart Holli Humbles with no answer.  Have advised caregiver/transporter to call our office back if any additional information is obtained when she gets back to the group home.  Plan  Patient Instructions  Healthy sleep regimen  Use caution sedating medications  Set up for in lab sleep study .  Follow up with our office in 6 weeks to discuss sleep study results and treatment plan

## 2022-11-26 NOTE — Patient Instructions (Addendum)
Healthy sleep regimen  Use caution sedating medications  Set up for in lab sleep study .  Follow up with our office in 6 weeks to discuss sleep study results and treatment plan

## 2022-11-26 NOTE — Progress Notes (Signed)
$@Patientj$  ID: Katherine Wolf, female    DOB: Apr 30, 1971, 52 y.o.   MRN: [id number]  Chief Complaint  Patient presents with   Consult    Referring provider: Trinidad Curet, MD  HPI: 52 year old female seen for November 26, 2022 for sleep consult for snoring .  Medical history significant for seizure disorder followed by neurology, schizophrenia cerebral palsy, developmental delay [facility]  TEST/EVENTS :  NPSG 09/2018 Negative for sleep apnea, AHI 0.2/hr, positive snoring, no significant oxygen desaturations, minimum O2 90%, no significant PLM's  11/26/2022 Sleep consult  Patient presents today for a sleep consult.  Patient is accompanied by her transporter from the group home.  They have very little information on the reason why she is here today.  Caregiver/transporter called her group home manager who reports that patient has been reported to have snoring and intermittent episodes of gasping during her sleep.  Patient does have developmental delay.  Says that she sleeps very well.  She is unaware that she snores.  Does have some daytime sleepiness.  She has a seizure disorder followed by neurology.  Caregiver/transporter says that her seizures have decreased but she continues to have seizures at least couple times a month.  Patient is on multiple medications including Depakote, Valium, hydroxyzine, Zyprexa, Vimpat.  We did call her group home for an updated med list as they did not come with any medical information.  Caregiver says that patient goes to bed each night around 9 PM.  Does take her a while to go to sleep.  And is up between 530 and 6:30 in the morning.   Allergies  Allergen Reactions   Penicillin G     Other reaction(s): Other unknown   Penicillins     On Mar- unsure of other information    Immunization History  Administered Date(s) Administered   Td 07/26/2020    Past Medical History:  Diagnosis Date   Anxiety    Cerebral palsy ([place])     Depression    Hip fracture ([facility])    Intellectual disability    Osteoporosis    Schizophrenia ([place])    Seizures ([place])     Tobacco History: Social History   Tobacco Use  Smoking Status Never  Smokeless Tobacco Never   Counseling given: Not Answered   Outpatient Medications Prior to Visit  Medication Sig Dispense Refill   acetaminophen (TYLENOL) 325 MG tablet Take 650 mg by mouth at bedtime.  (Patient not taking: Reported on 04/07/2022)     Calcium Carb-Cholecalciferol (CALCIUM+D3) 600-800 MG-UNIT TABS Take 1 tablet by mouth daily.     Calcium Carb-Cholecalciferol 500-600 MG-UNIT TABS Take by mouth.     cholecalciferol (VITAMIN D3) 25 MCG (1000 UT) tablet Take 1,000 Units by mouth daily.     Cholecalciferol 25 MCG (1000 UT) tablet Take by mouth.     citalopram (CELEXA) 20 MG tablet Take by mouth.     diazePAM (VALTOCO 15 MG DOSE NA) Place 1 spray into both nostrils as needed (seizure greater than or equal to 3 mins).     DIAZEPAM PO Place into the nose.     divalproex (DEPAKOTE) 250 MG DR tablet Take 625 mg twice daily 180 tablet 12   Docusate Sodium 150 MG/15ML syrup Take by mouth.     hydrOXYzine (ATARAX/VISTARIL) 50 MG tablet Take by mouth.     hydrOXYzine (VISTARIL) 50 MG capsule Take 50 mg by mouth 3 (three) times daily.  melatonin 3 MG TABS tablet Take by mouth.     mirabegron ER (MYRBETRIQ) 50 MG TB24 tablet Take 50 mg by mouth daily.     nitrofurantoin (MACRODANTIN) 100 MG capsule Take 100 mg by mouth 4 (four) times daily.     OLANZapine (ZYPREXA) 2.5 MG tablet every night.     Olopatadine HCl 0.2 % SOLN Place 1 drop into both eyes every morning.      Olopatadine HCl 0.2 % SOLN 1 drop 1 (one) time each day.     ondansetron (ZOFRAN ODT) 4 MG disintegrating tablet Take 1 tablet (4 mg total) by mouth every 8 (eight) hours as needed for nausea or vomiting. 20 tablet 0   pantoprazole (PROTONIX) 20 MG tablet Take 20 mg by mouth daily. (Patient not taking: Reported on  06/09/2021)     pantoprazole (PROTONIX) 20 MG tablet Take by mouth.     polyethylene glycol powder (GLYCOLAX/MIRALAX) 17 GM/SCOOP powder Take by mouth.     promethazine (PHENERGAN) 25 MG suppository Place 25 mg rectally every 4 (four) hours as needed for vomiting.      promethazine (PHENERGAN) 25 MG suppository Place rectally.     promethazine (PHENERGAN) 25 MG tablet Take 25 mg by mouth every 4 (four) hours as needed for vomiting.      promethazine (PHENERGAN) 25 MG tablet Take by mouth.     senna-docusate (SENOKOT-S) 8.6-50 MG tablet Take 1 tablet by mouth daily.     sodium fluoride (PREVIDENT 5000 PLUS) 1.1 % CREA dental cream Place 1 application onto teeth every evening.     trospium (SANCTURA) 20 MG tablet Take 20 mg by mouth 2 (two) times daily.     VIMPAT 200 MG TABS tablet Take 1 tablet (200 mg total) by mouth 2 (two) times daily. 60 tablet 5   No facility-administered medications prior to visit.     Review of Systems:   Constitutional:   No  weight loss, night sweats,  Fevers, chills, fatigue, or  lassitude.,  History of seizure disorder  HEENT:   No headaches, or  Sore throat,                No sneezing, itching, ear ache, nasal congestion, post nasal drip,   CV:  No chest pain,  Orthopnea, PND, swelling in lower extremities, anasarca, dizziness, palpitations, syncope.   GI  No heartburn, indigestion, abdominal pain, nausea, vomiting, diarrhea, change in bowel habits, loss of appetite, bloody stools.   Resp: No shortness of breath with exertion or at rest.  No excess mucus, no productive cough,  No non-productive cough,  No coughing up of blood.  No change in color of mucus.  No wheezing.  No chest wall deformity  Skin: no rash or lesions.  GU: no dysuria, change in color of urine, no urgency or frequency.  No flank pain, no hematuria   MS:  No joint pain or swelling.  No decreased range of motion.  No back pain.    Physical Exam  BP 110/70 (BP Location: Right Arm,  Patient Position: Sitting, Cuff Size: Normal)   Pulse 97   Temp 98.2 F (36.8 C) (Oral)   SpO2 97%   GEN: A/Ox3; pleasant , NAD, in wheelchair   HEENT:  [place]/AT,  NOSE-clear, THROAT-clear, no lesions, no postnasal drip or exudate noted.  Poor dentition class II-III MP airway  NECK:  Supple w/ fair ROM; no JVD; normal carotid impulses w/o bruits; no thyromegaly or nodules palpated; no lymphadenopathy.  RESP  Clear  P & A; w/o, wheezes/ rales/ or rhonchi. no accessory muscle use, no dullness to percussion  CARD:  RRR, no m/r/g, no peripheral edema, pulses intact, no cyanosis or clubbing.  GI:   Soft & nt; nml bowel sounds; no organomegaly or masses detected.   Musco: Warm bil, no deformities or joint swelling noted.   Neuro: alert, no focal deficits noted.    Skin: Warm, no lesions or rashes    Lab Results:    BNP No results found for: "BNP"  ProBNP No results found for: "PROBNP"  Imaging: No results found.        No data to display          No results found for: "NITRICOXIDE"      Assessment & Plan:   Snoring Snoring, gasping for air during sleep, restless sleep questionable underlying sleep apnea.  Previous sleep study in 2019 was negative for sleep apnea.  Patient is on multiple medications for mental health and seizure disorder.  Advised to use caution with sedating medications.  Can repeat in lab sleep study to evaluate for possible underlying sleep apnea.  Unfortunately, have limited medical history information for today's visit.  If repeat sleep study is negative.  Unsure if patient would be able to wear oral appliance for snoring. Attempted to call legal guardian listed on patient's chart Holli Humbles with no answer.  Have advised caregiver/transporter to call our office back if any additional information is obtained when she gets back to the group home.  Plan  Patient Instructions  Healthy sleep regimen  Use caution sedating medications  Set up  for in lab sleep study .  Follow up with our office in 6 weeks to discuss sleep study results and treatment plan       Rexene Edison, NP 11/26/2022

## 2022-11-27 NOTE — Progress Notes (Signed)
Reviewed and agree with assessment/plan.   Chesley Mires, MD [facility] Pulmonary/Critical Care 11/27/2022, 11:04 AM Pager:  [phone]

## 2022-12-11 ENCOUNTER — Encounter (HOSPITAL_BASED_OUTPATIENT_CLINIC_OR_DEPARTMENT_OTHER): Payer: Medicare Other | Admitting: Pulmonary Disease

## 2023-01-11 ENCOUNTER — Telehealth: Payer: Self-pay | Admitting: Adult Health

## 2023-01-11 NOTE — Telephone Encounter (Signed)
She is not a good candidate for home sleep study , but if can't do in lab then change to home sleep study

## 2023-01-11 NOTE — Telephone Encounter (Signed)
Pt's nurse is asking if a in home sleep study can be done rather than an in lab sleep study

## 2023-01-11 NOTE — Telephone Encounter (Signed)
ATC X1 left VM for patients nurse to give our office a call back

## 2023-01-11 NOTE — Telephone Encounter (Signed)
Tamika returning call

## 2023-01-11 NOTE — Telephone Encounter (Signed)
Spoke with patients nurse, she states patient will not be able to rest with a in-lab study and doesn't have anyone that can stay there with her. Family is requesting in lab study be changed to home sleep study if possible. Tammy can you advise? Are you okay with me placing HST order?

## 2023-01-12 ENCOUNTER — Other Ambulatory Visit: Payer: Self-pay

## 2023-01-12 DIAGNOSIS — R0683 Snoring: Secondary | ICD-10-CM

## 2023-01-12 NOTE — Telephone Encounter (Signed)
Spoke with patients nurse. Advised order for hst has been placed. NFN

## 2023-01-26 ENCOUNTER — Encounter: Payer: Self-pay | Admitting: Diagnostic Neuroimaging

## 2023-01-26 ENCOUNTER — Ambulatory Visit (INDEPENDENT_AMBULATORY_CARE_PROVIDER_SITE_OTHER): Payer: Medicare Other | Admitting: Diagnostic Neuroimaging

## 2023-01-26 VITALS — BP 101/65 | HR 99 | Ht 63.0 in | Wt 160.0 lb

## 2023-01-26 DIAGNOSIS — R6250 Unspecified lack of expected normal physiological development in childhood: Secondary | ICD-10-CM | POA: Diagnosis not present

## 2023-01-26 DIAGNOSIS — G40909 Epilepsy, unspecified, not intractable, without status epilepticus: Secondary | ICD-10-CM | POA: Diagnosis not present

## 2023-01-26 NOTE — Progress Notes (Signed)
Chief Complaint  Patient presents with   Follow-up    Patient in room #7 with her caregiver. Patient states she is well and stable, no new concerns. Patient caregiver states she had a seizure this past weekend.    History of Present Illness:  UPDATE (01/26/23, VRP): Since last visit, doing well.. Symptoms are stable. Had 1 seizure last weekend. Tolerating meds.  Still 5-6 seizures per month.   UPDATE (04/07/22, VRP): Since last visit, doing well. Sz are 1-2 per week. No shaking with seizures.   UPDATE (06/09/21, VRP): Since last visit, continues with seizures (staring, unresponsive; no convulsions/ Avg 2-6 sz spells per month. Now on divalproex 625 / 500; not clear when dose was reduced (was supposed to be increased to  twice a day at last visit).  UPDATE (04/09/20, VRP): Since last visit, had 4 seizures since last visit. Otherwise symptoms stable. No alleviating or aggravating factors. Tolerating meds.    UPDATE (11/07/19, VRP):  - since last visit, doing well; no seizures; no more falls; using wheelchair, gait belt, 24 hour supervision - tolerating meds; doing well - BP stable  UPDATE (07/31/19 A Lomax): Katherine Wolf is a 52 y.o. female here today for follow up for seizure and recurrent falls.  She continues divalproex 500 mg twice daily as well as Vimpat 200 mg twice daily. She was last seen about 4 weeks ago and concerns were present for orthostatic blood pressures. She was advised to see PCP to review need for metoprolol  BID.  No medications changes were made.  Patient presents with her caregiver today who states that she is doing much better.  No seizure activity or fall since last being seen 9/16.  She is tolerating medications well without obvious adverse effects.  She is being assessed by physical therapy at [facility].  She reports feeling well today and without concerns.   UPDATE (06/28/2019 A Lomax): Katherine Wolf is a 52 y.o. female with CP here today for follow up  of seizure and multiple falls. She is a resident at [facility]. Caregiver with her today has limited information but aids in history.  Caregiver reports that she has had approx 7 falls in the past month. She has also had multiple witnessed seizures, some requiring ER eval (11/2018 and 06/2019). With each evaluation divalproex was found to be subtherapeutic. On 9/5 divalproex was increased to  twice daily.  ER evaluation yesterday shows valproic acid level of 76. Caregiver thinks that she may have had "a mild seizure" about 4 days ago but uncertain of details. She was told that Katherine Wolf was standing at the sink in the kitchen and staff heard a loud noise. When they saw her she had fallen to the floor. When they got to her they noticed her eyes were rolling. On call provider called and decision was made to observe her. No seizure activity noted since.    She was started on divalproex  BID in 04/2018 by PCP. At follow up, 2 weeks later, dose was decreased to  BID due to concerns of "movement disorder and lethargy."   She is in a room with 5 other patients. She usually walks without assistance. She has not required assistive devices. She needs very minimal assistance with bathing and dressing. Recently, they have started using gait belt and walker or wheelchair for long distances. BP is not routinely checked. Today readings are 85/63 (at beginning of visit) and 108/60 (with standing). Patient does report feeling dizzy  with standing, however, caregiver states that she will repeat words said to her. She is taking metoprolol  twice daily prescribed by PCP.    PRIOR HPI (10/31/18, VRP): 52 year old female with developmental delay, here for evaluation of seizure disorder.  Patient arrives with transporter.  Patient and transporter do not have any additional clinical information.  I reviewed referring records in chart.  Last seizure June 2018.  Patient is stable on Vimpat and Depakote.  No  triggering or aggravating factors.    Outpatient Encounter Medications as of 01/26/2023  Medication Sig   Calcium Carb-Cholecalciferol (CALCIUM+D3) 600-800 MG-UNIT TABS Take 1 tablet by mouth daily.   cholecalciferol (VITAMIN D3) 25 MCG (1000 UT) tablet Take 1,000 Units by mouth daily.   citalopram (CELEXA) 20 MG tablet Take by mouth.   divalproex (DEPAKOTE) 250 MG DR tablet Take 625 mg twice daily   Docusate Sodium 150 MG/15ML syrup Take by mouth.   hydrOXYzine (ATARAX/VISTARIL) 50 MG tablet Take by mouth.   hydrOXYzine (VISTARIL) 50 MG capsule Take 50 mg by mouth 3 (three) times daily.    OLANZapine (ZYPREXA) 2.5 MG tablet every night.   Olopatadine HCl 0.2 % SOLN Place 1 drop into both eyes every morning.    Olopatadine HCl 0.2 % SOLN 1 drop 1 (one) time each day.   ondansetron (ZOFRAN ODT) 4 MG disintegrating tablet Take 1 tablet (4 mg total) by mouth every 8 (eight) hours as needed for nausea or vomiting.   pantoprazole (PROTONIX) 20 MG tablet Take 20 mg by mouth daily.   pantoprazole (PROTONIX) 20 MG tablet Take by mouth.   polyethylene glycol powder (GLYCOLAX/MIRALAX) 17 GM/SCOOP powder Take by mouth.   promethazine (PHENERGAN) 25 MG suppository Place 25 mg rectally every 4 (four) hours as needed for vomiting.    promethazine (PHENERGAN) 25 MG suppository Place rectally.   promethazine (PHENERGAN) 25 MG tablet Take by mouth.   senna-docusate (SENOKOT-S) 8.6-50 MG tablet Take 1 tablet by mouth daily.   sodium fluoride (PREVIDENT 5000 PLUS) 1.1 % CREA dental cream Place 1 application onto teeth every evening.   trospium (SANCTURA) 20 MG tablet Take 20 mg by mouth 2 (two) times daily.   VIMPAT 200 MG TABS tablet Take 1 tablet (200 mg total) by mouth 2 (two) times daily.   diazePAM (VALTOCO 15 MG DOSE NA) Place 1 spray into both nostrils as needed (seizure greater than or equal to 3 mins). (Patient not taking: Reported on 01/26/2023)   DIAZEPAM PO Place into the nose. (Patient not taking:  Reported on 01/26/2023)   melatonin 3 MG TABS tablet Take by mouth. (Patient not taking: Reported on 01/26/2023)   mirabegron ER (MYRBETRIQ) 50 MG TB24 tablet Take 50 mg by mouth daily. (Patient not taking: Reported on 01/26/2023)   promethazine (PHENERGAN) 25 MG tablet Take 25 mg by mouth every 4 (four) hours as needed for vomiting.  (Patient not taking: Reported on 01/26/2023)   [DISCONTINUED] acetaminophen (TYLENOL) 325 MG tablet Take 650 mg by mouth at bedtime.  (Patient not taking: Reported on 04/07/2022)   [DISCONTINUED] Calcium Carb-Cholecalciferol 500-600 MG-UNIT TABS Take by mouth. (Patient not taking: Reported on 01/26/2023)   [DISCONTINUED] Cholecalciferol 25 MCG (1000 UT) tablet Take by mouth. (Patient not taking: Reported on 01/26/2023)   [DISCONTINUED] nitrofurantoin (MACRODANTIN) 100 MG capsule Take 100 mg by mouth 4 (four) times daily. (Patient not taking: Reported on 01/26/2023)   No facility-administered encounter medications on file as of 01/26/2023.       Observations/Objective:  GENERAL EXAM/CONSTITUTIONAL: Vitals:  Vitals:   01/26/23 1310  BP: 101/65  Pulse: 99  Weight: 160 lb (72.6 kg)  Height: 5\' 3"  (1.6 m)   Body mass index is 28.34 kg/m. Wt Readings from Last 3 Encounters:  01/26/23 160 lb (72.6 kg)  05/30/21 160 lb (72.6 kg)  05/27/21 160 lb (72.6 kg)   Patient is in no distress; well developed, nourished and groomed; neck is supple  CARDIOVASCULAR: Examination of carotid arteries is normal; no carotid bruits Regular rate and rhythm, no murmurs Examination of peripheral vascular system by observation and palpation is normal  EYES: Ophthalmoscopic exam of optic discs and posterior segments is normal; no papilledema or hemorrhages No results found.  MUSCULOSKELETAL: Gait, strength, tone, movements noted in Neurologic exam below  NEUROLOGIC: MENTAL STATUS:      No data to display         DECR MEMORY  DECR attention and concentration DECR  FLUENCY; FOLLOWS SIMPLE COMMANDS DECR fund of knowledge PLEASANT, SMILING  CRANIAL NERVE:  2nd - no papilledema on fundoscopic exam 2nd, 3rd, 4th, 6th - pupils equal and reactive to light, visual fields full to confrontation, extraocular muscles intact, no nystagmus 5th - facial sensation symmetric 7th - facial strength symmetric 8th - hearing intact 9th - palate elevates symmetrically, uvula midline 11th - shoulder shrug symmetric 12th - tongue protrusion midline  MOTOR:  SLOW MOVEMENTS; DIFFUSE 4/5 STRENGTH RARE RESTING TREMOR IN BUE; MOD POSTURAL TREMOR ALSO; R > L  SENSORY:  normal and symmetric to light touch  COORDINATION:  finger-nose-finger, fine finger movements SLOW  REFLEXES:  deep tendon reflexes present and symmetric  GAIT/STATION:  IN WHEEL CHAIR   Lab Results  Component Value Date   VALPROATE 73 05/27/2021    Assessment and Plan:  Dx:  1. Seizure disorder   2. Developmental delay      SEIZURE DISORDER (~4-6 seizures per month; staring spells, eyes fluttering, confusion) - continue vimpat 200mg  twice a day - continue divalproex 625mg  twice a day - refills per Dr. 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    Suanne Marker, MD 01/26/2023, 1:32 PM Certified in Neurology, Neurophysiology and Neuroimaging  [facility] [address], [address] [place], [place] [address] [phone]

## 2023-03-17 ENCOUNTER — Ambulatory Visit: Payer: Medicare Other | Admitting: Adult Health

## 2023-03-17 DIAGNOSIS — G4733 Obstructive sleep apnea (adult) (pediatric): Secondary | ICD-10-CM

## 2023-03-17 DIAGNOSIS — R0683 Snoring: Secondary | ICD-10-CM

## 2023-04-02 ENCOUNTER — Other Ambulatory Visit: Payer: Self-pay | Admitting: Family Medicine

## 2023-04-02 DIAGNOSIS — Z993 Dependence on wheelchair: Secondary | ICD-10-CM

## 2023-04-07 ENCOUNTER — Telehealth: Payer: Self-pay | Admitting: Pulmonary Disease

## 2023-04-07 DIAGNOSIS — G4733 Obstructive sleep apnea (adult) (pediatric): Secondary | ICD-10-CM | POA: Diagnosis not present

## 2023-04-07 NOTE — Telephone Encounter (Signed)
Call patient  Sleep study result  Date of study: 03/17/2023  Impression: Mild obstructive sleep apnea Mild oxygen desaturations  Recommendation: Options of treatment for mild obstructive sleep apnea will include  1.  CPAP therapy if there is significant daytime sleepiness or other comorbidities including history of CVA or cardiac disease  -If CPAP is chosen as an option of treatment auto titrating CPAP with a pressure setting of 5-15 will be appropriate  2.  Watchful waiting with emphasis on weight loss measures, sleep position modification to optimize lateral sleep, elevating the head of the bed by about 30 degrees may also help.  3.  An oral device may be fashioned for the treatment of mild sleep disordered breathing, will involve referral to dentist.   Follow-up as previously scheduled

## 2023-04-08 NOTE — Telephone Encounter (Signed)
Please set up ov to discuss results and treatment plan  

## 2023-04-09 NOTE — Progress Notes (Signed)
ATC patient's guardian, Karie Fetch at [phone].  LVM to return call to set up OV.

## 2023-04-13 NOTE — Telephone Encounter (Signed)
ATC x1 LVM for patient to call our office back to schedule a f/u with Tammy to go over sleep study results and treatment.

## 2023-04-14 ENCOUNTER — Encounter: Payer: Self-pay | Admitting: *Deleted

## 2023-04-14 NOTE — Progress Notes (Signed)
Unable to reach letter mailed to Guardian.

## 2023-04-14 NOTE — Progress Notes (Signed)
ATC x2.  LVM to return call.

## 2023-04-19 NOTE — Telephone Encounter (Signed)
See notes, I have attempted to reach the patient's Guardian and left multiple VM.  I have also sent a letter to her Guardian.  Awaiting a response.

## 2023-04-21 NOTE — Telephone Encounter (Signed)
PT's caretaker ret our call.  States the number to the care home is [phone].(Melissa White is over the home.) I messaged Heather to see if she needed anything more but she was NA.

## 2023-04-29 NOTE — Telephone Encounter (Signed)
Nurse from group home calling to go over her hst results

## 2023-04-29 NOTE — Telephone Encounter (Signed)
Attempted to return call, sent to VM.  Left detailed message and left my direct # to call back [phone]).

## 2023-04-29 NOTE — Telephone Encounter (Signed)
I called and spoke with Efraim Kaufmann, group home director.  Melissa stated [facility] takes care of patient appointments.  I called [facility] [phone] and patient is scheduled 06/03/23 at 1000 with Tammy,NP for sleep study results,recommendations follow up.  Results and recommendations have been faxed per facility request to [phone]. Nothing further at this time.

## 2023-05-10 ENCOUNTER — Telehealth: Payer: Self-pay | Admitting: Pulmonary Disease

## 2023-05-14 NOTE — Telephone Encounter (Signed)
Notes faxed.

## 2023-06-03 ENCOUNTER — Ambulatory Visit (INDEPENDENT_AMBULATORY_CARE_PROVIDER_SITE_OTHER): Payer: Medicare Other | Admitting: Adult Health

## 2023-06-03 VITALS — BP 108/78 | HR 89 | Ht 71.0 in | Wt 143.0 lb

## 2023-06-03 DIAGNOSIS — G4733 Obstructive sleep apnea (adult) (pediatric): Secondary | ICD-10-CM

## 2023-06-03 NOTE — Patient Instructions (Signed)
Begin CPAP At bedtime , wear all night long while sleeping  Order for Nasal pillows- DO NOT USE FULL FACE MASK Use caution with sedating medications  Follow up in 2 months and As needed

## 2023-06-03 NOTE — Progress Notes (Signed)
@Patient  ID: Katherine Wolf, female    DOB: 07-31-1971, 52 y.o.   MRN: [id number]  Chief Complaint  Patient presents with   Follow-up    Referring provider: Lucretia Field, MD  HPI: 52 year old female seen for sleep consult on November 26, 2022 for snoring and daytime sleepiness found to have mild obstructive sleep apnea Medical history significant for seizure disorder, schizophrenia, cerebral palsy, developmental delay Resident of the [facility] group home  TEST/EVENTS :  NPSG 09/2018 Negative for sleep apnea, AHI 0.2/hr, positive snoring, no significant oxygen desaturations, minimum O2 90%, no significant PLM's   06/03/2023 Follow up ; OSA  Patient returns today for a 32-month follow-up.  She is accompanied by group home caregiver.  Patient was seen in February for a sleep consult for snoring and excessive daytime sleepiness.  She lives at a group home.  She has a history of seizure disorder, schizophrenia, cerebral palsy and developmental delay.  She was set up for a in lab sleep study but was unable to complete in lab and was changed over to a home sleep study.  Home sleep study was completed on March 17, 2023 that showed mild sleep apnea with a AHI at 9.8/hour and SpO2 low at 71%.  I reviewed these results with patient group home caregiver and also called the staff nurse at the group home, [place], and explained these results to them and went over treatment options such as positional sleep keeping the head of the bed elevated to 30 degrees.  We also discussed CPAP therapy as patient has seizure disorder with breakthrough seizures.  Followed by neurology, takes seizure medications. Has 1-2 breakthrough seizures a week. I have also left a message for her guardian Karie Fetch.  According to caregivers patient is sleepy during the daytime falls asleep often.  Also has loud snoring.  Allergies  Allergen Reactions   Penicillin G     Other reaction(s): Other unknown   Penicillins     On  Mar- unsure of other information    Immunization History  Administered Date(s) Administered   Td 07/26/2020    Past Medical History:  Diagnosis Date   Anxiety    Cerebral palsy (HCC)    Depression    Hip fracture (HCC)    Intellectual disability    Osteoporosis    Schizophrenia (HCC)    Seizures (HCC)     Tobacco History: Social History   Tobacco Use  Smoking Status Never  Smokeless Tobacco Never   Counseling given: Not Answered   Outpatient Medications Prior to Visit  Medication Sig Dispense Refill   Calcium Carb-Cholecalciferol (CALCIUM+D3) 600-800 MG-UNIT TABS Take 1 tablet by mouth daily.     cholecalciferol (VITAMIN D3) 25 MCG (1000 UT) tablet Take 1,000 Units by mouth daily.     citalopram (CELEXA) 20 MG tablet Take by mouth.     diazePAM (VALTOCO 15 MG DOSE NA) Place 1 spray into both nostrils as needed (seizure greater than or equal to 3 mins). (Patient not taking: Reported on 01/26/2023)     DIAZEPAM PO Place into the nose. (Patient not taking: Reported on 01/26/2023)     divalproex (DEPAKOTE) 250 MG DR tablet Take 625 mg twice daily 180 tablet 12   Docusate Sodium 150 MG/15ML syrup Take by mouth.     hydrOXYzine (ATARAX/VISTARIL) 50 MG tablet Take by mouth.     hydrOXYzine (VISTARIL) 50 MG capsule Take 50 mg by mouth 3 (three) times daily.  melatonin 3 MG TABS tablet Take by mouth. (Patient not taking: Reported on 01/26/2023)     mirabegron ER (MYRBETRIQ) 50 MG TB24 tablet Take 50 mg by mouth daily. (Patient not taking: Reported on 01/26/2023)     OLANZapine (ZYPREXA) 2.5 MG tablet every night.     Olopatadine HCl 0.2 % SOLN Place 1 drop into both eyes every morning.      Olopatadine HCl 0.2 % SOLN 1 drop 1 (one) time each day.     ondansetron (ZOFRAN ODT) 4 MG disintegrating tablet Take 1 tablet (4 mg total) by mouth every 8 (eight) hours as needed for nausea or vomiting. 20 tablet 0   pantoprazole (PROTONIX) 20 MG tablet Take 20 mg by mouth daily.      pantoprazole (PROTONIX) 20 MG tablet Take by mouth.     polyethylene glycol powder (GLYCOLAX/MIRALAX) 17 GM/SCOOP powder Take by mouth.     promethazine (PHENERGAN) 25 MG suppository Place 25 mg rectally every 4 (four) hours as needed for vomiting.      promethazine (PHENERGAN) 25 MG suppository Place rectally.     promethazine (PHENERGAN) 25 MG tablet Take 25 mg by mouth every 4 (four) hours as needed for vomiting.  (Patient not taking: Reported on 01/26/2023)     promethazine (PHENERGAN) 25 MG tablet Take by mouth.     senna-docusate (SENOKOT-S) 8.6-50 MG tablet Take 1 tablet by mouth daily.     sodium fluoride (PREVIDENT 5000 PLUS) 1.1 % CREA dental cream Place 1 application onto teeth every evening.     trospium (SANCTURA) 20 MG tablet Take 20 mg by mouth 2 (two) times daily.     VIMPAT 200 MG TABS tablet Take 1 tablet (200 mg total) by mouth 2 (two) times daily. 60 tablet 5   No facility-administered medications prior to visit.     Review of Systems:   Constitutional:   No  weight loss, night sweats,  Fevers, chills, fatigue, or  lassitude.  HEENT:   No headaches,  Difficulty swallowing,  Tooth/dental problems, or  Sore throat,                No sneezing, itching, ear ache, nasal congestion, post nasal drip,   CV:  No chest pain,  Orthopnea, PND, swelling in lower extremities, anasarca, dizziness, palpitations, syncope.   GI  No heartburn, indigestion, abdominal pain, nausea, vomiting, diarrhea, change in bowel habits, loss of appetite, bloody stools.   Resp: No shortness of breath with exertion or at rest.  No excess mucus, no productive cough,  No non-productive cough,  No coughing up of blood.  No change in color of mucus.  No wheezing.  No chest wall deformity  Skin: no rash or lesions.  GU: no dysuria, change in color of urine, no urgency or frequency.  No flank pain, no hematuria   MS:  No joint pain or swelling.  No decreased range of motion.  No back pain.  Neuro :  +seizure disorder     Physical Exam  BP 108/78 (BP Location: Right Arm, Patient Position: Sitting, Cuff Size: Normal)   Pulse 89   Ht 5\' 11"  (1.803 m)   Wt 143 lb (64.9 kg)   SpO2 99%   BMI 19.94 kg/m   GEN: A/Ox3; pleasant , NAD, well nourished . Wheelchair    HEENT:  [place]/AT,  EACs-clear, TMs-wnl, NOSE-clear, THROAT-clear, no lesions, no postnasal drip or exudate noted. Class 3 MP airway   NECK:  Supple w/ fair  ROM; no JVD; normal carotid impulses w/o bruits; no thyromegaly or nodules palpated; no lymphadenopathy.    RESP  Clear  P & A; w/o, wheezes/ rales/ or rhonchi. no accessory muscle use, no dullness to percussion  CARD:  RRR, no m/r/g, no peripheral edema, pulses intact, no cyanosis or clubbing.  GI:   Soft & nt; nml bowel sounds; no organomegaly or masses detected.   Musco: Warm bil, no deformities or joint swelling noted.   Neuro: alert, no focal deficits noted.  UE tremor   Skin: Warm, no lesions or rashes    Lab Results:    BNP No results found for: "BNP"  ProBNP No results found for: "PROBNP"  Imaging: No results found.  Administration History     None           No data to display          No results found for: "NITRICOXIDE"      Assessment & Plan:   OSA (obstructive sleep apnea) Mild obstructive sleep apnea-I discussed her sleep study results with group home caregiver that is with her today and also called the staff nurse at the group home and discussed her results and treatment plan,.  I have left a message for her legal guardian Karie Fetch.  With her underlying seizure disorder along with breakthrough seizures.  Would benefit from managing her sleep apnea.  However I worry that using a fullface mask could potentially be hazardous to her if she had a seizure and increase her risk for aspiration.  We can try a nasal mask only and see if she can tolerate this otherwise I would just recommend avoiding sleeping on her back keeping the  head of the bed at 30 degrees if this is an option. Case was discussed with Dr. Maple Hudson We have sent an order over to the homecare company specifically says to only use nasal mask and that she is not to use a fullface mask.  I have explained to the caregiver that is present today at the visit and also the staff nurse these instructions as well.  Plan  Patient Instructions  Begin CPAP At bedtime , wear all night long while sleeping  Order for Nasal pillows- DO NOT USE FULL FACE MASK Use caution with sedating medications  Follow up in 2 months and As needed         Rubye Oaks, NP 06/03/2023

## 2023-06-03 NOTE — Assessment & Plan Note (Addendum)
Mild obstructive sleep apnea-I discussed her sleep study results with group home caregiver that is with her today and also called the staff nurse at the group home and discussed her results and treatment plan,.  I have left a message for her legal guardian Karie Fetch.  With her underlying seizure disorder along with breakthrough seizures.  Would benefit from managing her sleep apnea.  However I worry that using a fullface mask could potentially be hazardous to her if she had a seizure and increase her risk for aspiration.  We can try a nasal mask only and see if she can tolerate this otherwise I would just recommend avoiding sleeping on her back keeping the head of the bed at 30 degrees if this is an option. Case was discussed with Dr. Maple Hudson We have sent an order over to the homecare company specifically says to only use nasal mask and that she is not to use a fullface mask.  I have explained to the caregiver that is present today at the visit and also the staff nurse these instructions as well.  Plan  Patient Instructions  Begin CPAP At bedtime , wear all night long while sleeping  Order for Nasal pillows- DO NOT USE FULL FACE MASK Use caution with sedating medications  Follow up in 2 months and As needed

## 2023-06-15 ENCOUNTER — Telehealth: Payer: Self-pay | Admitting: Adult Health

## 2023-06-15 NOTE — Telephone Encounter (Signed)
Vernona Rieger is calling back for Katherine Wolf. She would like the group home to be able to receive information as well. If there is a note that needs to be completed please email to her email address at [email or web address].

## 2023-06-21 NOTE — Telephone Encounter (Signed)
Spoke with Vernona Rieger and advised we cannot send HIPAA sensitive information to an unsecured email address. She may receive information via secure fax if she can provide a number. She states she does not currently need anything, was just wanting to confirm patient's LOV and recommendations going forward. Advised CPAP had been ordered and [facility] would be attempting to reach the patient at the primary number in the chart. She will contact group home to see if they have received any phone calls from [facility]. Nothing further needed at this time.

## 2023-11-09 ENCOUNTER — Ambulatory Visit (INDEPENDENT_AMBULATORY_CARE_PROVIDER_SITE_OTHER): Payer: Medicare Other | Admitting: Adult Health

## 2023-11-09 ENCOUNTER — Encounter: Payer: Self-pay | Admitting: Adult Health

## 2023-11-09 VITALS — BP 100/64 | HR 101 | Ht 67.0 in | Wt 132.8 lb

## 2023-11-09 DIAGNOSIS — J310 Chronic rhinitis: Secondary | ICD-10-CM | POA: Diagnosis not present

## 2023-11-09 DIAGNOSIS — G4733 Obstructive sleep apnea (adult) (pediatric): Secondary | ICD-10-CM | POA: Diagnosis not present

## 2023-11-09 NOTE — Assessment & Plan Note (Signed)
Chronic rhinitis.  Rec recommend supportive care.  If not improving will need further evaluation  Plan  Patient Instructions  Sleep with Head of bed elevated at 30 degrees  Avoid sleeping on your back  Use caution with sedating medications   May try Flonase 2 puffs daily As needed  nasal congestion  Claritin 10mg  At bedtime  As needed  drainage  Saline nasal gel At bedtime   Delsym 2 tsp Twice daily  As needed  cough .   Follow up with our office As needed   Follow up with Primary Provider as planned and As needed   Please contact office for sooner follow up if symptoms do not improve or worsen or seek emergency care

## 2023-11-09 NOTE — Patient Instructions (Signed)
Sleep with Head of bed elevated at 30 degrees  Avoid sleeping on your back  Use caution with sedating medications   May try Flonase 2 puffs daily As needed  nasal congestion  Claritin 10mg  At bedtime  As needed  drainage  Saline nasal gel At bedtime   Delsym 2 tsp Twice daily  As needed  cough .   Follow up with our office As needed   Follow up with Primary Provider as planned and As needed   Please contact office for sooner follow up if symptoms do not improve or worsen or seek emergency care

## 2023-11-09 NOTE — Assessment & Plan Note (Signed)
Mild obstructive sleep apnea.  Patient unfortunately's been unable to tolerate CPAP.  I spoke with group home caregiver and Production designer, theatre/television/film.  CPAP has been turned back into the homecare company.  Advised patient to sleep with head of the bed at 30 degree angle, try to avoid sleeping on her back.  Do not feel that oral appliance would be an option. Healthy sleep regimen discussed in detail.  Patient is to follow back with our office as needed.  Plan  . Patient Instructions  Sleep with Head of bed elevated at 30 degrees  Avoid sleeping on your back  Use caution with sedating medications   May try Flonase 2 puffs daily As needed  nasal congestion  Claritin 10mg  At bedtime  As needed  drainage  Saline nasal gel At bedtime   Delsym 2 tsp Twice daily  As needed  cough .   Follow up with our office As needed   Follow up with Primary Provider as planned and As needed   Please contact office for sooner follow up if symptoms do not improve or worsen or seek emergency care

## 2023-11-09 NOTE — Progress Notes (Signed)
@Patient  ID: Katherine Wolf, female    DOB: 19-Oct-1970, 53 y.o.   MRN: [id number]  Chief Complaint  Patient presents with   Follow-up    Referring provider: Lucretia Field, MD  HPI: 53 year old female seen for sleep consult February 2024 for snoring daytime sleepiness found to have mild obstructive sleep apnea Medical history significant for seizure disorder, schizophrenia, cerebral palsy, developmental delay Resident of the [facility] group home  TEST/EVENTS :  NPSG 09/2018 Negative for sleep apnea, AHI 0.2/hr, positive snoring, no significant oxygen desaturations, minimum O2 90%, no significant PLM's   Home sleep study was completed on March 17, 2023 that showed mild sleep apnea with a AHI at 9.8/hour and SpO2 low at 71%.   11/09/2023 Follow up ; OSA Patient presents for a follow-up visit.  Last seen August 2024.  Patient has underlying mild obstructive sleep apnea.  Last visit patient had been recommended to begin his CPAP.  Unfortunately patient was unable to tolerate.  Patient is accompanied by a caregiver at her group home.  Also spoke to the group home Production designer, theatre/television/film.  They state that patient was tried multiple times to keep her CPAP on but continued to take it all.  She did try the nasal mask but was still unable to tolerate.  And CPAP was returned back to DME company.  Patient says that she did not like how the mask felt on her face.  Caregiver says that she continuously took off the CPAP mask.  Does complain of some nasal stuffiness and dryness and intermittent dry cough.  Especially at nighttime.    Allergies  Allergen Reactions   Penicillin G     Other reaction(s): Other unknown   Penicillins     On Mar- unsure of other information    Immunization History  Administered Date(s) Administered   Td 07/26/2020    Past Medical History:  Diagnosis Date   Anxiety    Cerebral palsy (HCC)    Depression    Hip fracture (HCC)    Intellectual disability    Osteoporosis     Schizophrenia (HCC)    Seizures (HCC)     Tobacco History: Social History   Tobacco Use  Smoking Status Never  Smokeless Tobacco Never   Counseling given: Not Answered   Outpatient Medications Prior to Visit  Medication Sig Dispense Refill   Calcium Carb-Cholecalciferol (CALCIUM+D3) 600-800 MG-UNIT TABS Take 1 tablet by mouth daily.     cholecalciferol (VITAMIN D3) 25 MCG (1000 UT) tablet Take 1,000 Units by mouth daily.     citalopram (CELEXA) 20 MG tablet Take by mouth.     divalproex (DEPAKOTE) 250 MG DR tablet Take 625 mg twice daily 180 tablet 12   Docusate Sodium 150 MG/15ML syrup Take by mouth.     hydrOXYzine (ATARAX/VISTARIL) 50 MG tablet Take by mouth.     hydrOXYzine (VISTARIL) 50 MG capsule Take 50 mg by mouth 3 (three) times daily.      OLANZapine (ZYPREXA) 2.5 MG tablet every night.     Olopatadine HCl 0.2 % SOLN Place 1 drop into both eyes every morning.      Olopatadine HCl 0.2 % SOLN 1 drop 1 (one) time each day.     ondansetron (ZOFRAN ODT) 4 MG disintegrating tablet Take 1 tablet (4 mg total) by mouth every 8 (eight) hours as needed for nausea or vomiting. 20 tablet 0   pantoprazole (PROTONIX) 20 MG tablet Take 20 mg by mouth daily.  pantoprazole (PROTONIX) 20 MG tablet Take by mouth.     polyethylene glycol powder (GLYCOLAX/MIRALAX) 17 GM/SCOOP powder Take by mouth.     promethazine (PHENERGAN) 25 MG suppository Place 25 mg rectally every 4 (four) hours as needed for vomiting.      promethazine (PHENERGAN) 25 MG suppository Place rectally.     promethazine (PHENERGAN) 25 MG tablet Take by mouth.     senna-docusate (SENOKOT-S) 8.6-50 MG tablet Take 1 tablet by mouth daily.     sodium fluoride (PREVIDENT 5000 PLUS) 1.1 % CREA dental cream Place 1 application onto teeth every evening.     trospium (SANCTURA) 20 MG tablet Take 20 mg by mouth 2 (two) times daily.     VIMPAT 200 MG TABS tablet Take 1 tablet (200 mg total) by mouth 2 (two) times daily. 60 tablet 5    diazePAM (VALTOCO 15 MG DOSE NA) Place 1 spray into both nostrils as needed (seizure greater than or equal to 3 mins). (Patient not taking: Reported on 01/26/2023)     DIAZEPAM PO Place into the nose. (Patient not taking: Reported on 11/09/2023)     melatonin 3 MG TABS tablet Take by mouth. (Patient not taking: Reported on 11/09/2023)     mirabegron ER (MYRBETRIQ) 50 MG TB24 tablet Take 50 mg by mouth daily. (Patient not taking: Reported on 01/26/2023)     promethazine (PHENERGAN) 25 MG tablet Take 25 mg by mouth every 4 (four) hours as needed for vomiting.  (Patient not taking: Reported on 01/26/2023)     No facility-administered medications prior to visit.     Review of Systems:   Constitutional:   No  weight loss, night sweats,  Fevers, chills, fatigue, or  lassitude.  HEENT:   No headaches,  Difficulty swallowing,  Tooth/dental problems, or  Sore throat,                No sneezing, itching, ear ache, +nasal congestion, post nasal drip,   CV:  No chest pain,  Orthopnea, PND, swelling in lower extremities, anasarca, dizziness, palpitations, syncope.   GI  No heartburn, indigestion, abdominal pain, nausea, vomiting, diarrhea, change in bowel habits, loss of appetite, bloody stools.   Resp: No shortness of breath with exertion or at rest.  No excess mucus, no productive cough,  No non-productive cough,  No coughing up of blood.  No change in color of mucus.  No wheezing.  No chest wall deformity  Skin: no rash or lesions.  GU: no dysuria, change in color of urine, no urgency or frequency.  No flank pain, no hematuria   MS:  No joint pain or swelling.  No decreased range of motion.  No back pain.    Physical Exam  BP 100/64 (BP Location: Right Arm, Patient Position: Sitting, Cuff Size: Normal)   Pulse (!) 101   Ht 5\' 7"  (1.702 m)   Wt 132 lb 12.8 oz (60.2 kg)   SpO2 98%   BMI 20.80 kg/m   GEN: A/Ox3; pleasant , NAD, wheelchair   HEENT:  [place]/AT, NOSE-clear, THROAT-clear, no  lesions, no postnasal drip or exudate noted.  Poor dentition  NECK:  Supple w/ fair ROM; no JVD; normal carotid impulses w/o bruits; no thyromegaly or nodules palpated; no lymphadenopathy.    RESP  Clear  P & A; w/o, wheezes/ rales/ or rhonchi. no accessory muscle use, no dullness to percussion  CARD:  RRR, no m/r/g, no peripheral edema, pulses intact, no cyanosis or clubbing.  GI:   Soft & nt; nml bowel sounds; no organomegaly or masses detected.      Lab Results:  CBC    BNP No results found for: "BNP"  ProBNP No results found for: "PROBNP"  Imaging: No results found.  Administration History     None           No data to display          No results found for: "NITRICOXIDE"      Assessment & Plan:   OSA (obstructive sleep apnea) Mild obstructive sleep apnea.  Patient unfortunately's been unable to tolerate CPAP.  I spoke with group home caregiver and Production designer, theatre/television/film.  CPAP has been turned back into the homecare company.  Advised patient to sleep with head of the bed at 30 degree angle, try to avoid sleeping on her back.  Do not feel that oral appliance would be an option. Healthy sleep regimen discussed in detail.  Patient is to follow back with our office as needed.  Plan  . Patient Instructions  Sleep with Head of bed elevated at 30 degrees  Avoid sleeping on your back  Use caution with sedating medications   May try Flonase 2 puffs daily As needed  nasal congestion  Claritin 10mg  At bedtime  As needed  drainage  Saline nasal gel At bedtime   Delsym 2 tsp Twice daily  As needed  cough .   Follow up with our office As needed   Follow up with Primary Provider as planned and As needed   Please contact office for sooner follow up if symptoms do not improve or worsen or seek emergency care      Chronic rhinitis Chronic rhinitis.  Rec recommend supportive care.  If not improving will need further evaluation  Plan  Patient Instructions  Sleep with Head  of bed elevated at 30 degrees  Avoid sleeping on your back  Use caution with sedating medications   May try Flonase 2 puffs daily As needed  nasal congestion  Claritin 10mg  At bedtime  As needed  drainage  Saline nasal gel At bedtime   Delsym 2 tsp Twice daily  As needed  cough .   Follow up with our office As needed   Follow up with Primary Provider as planned and As needed   Please contact office for sooner follow up if symptoms do not improve or worsen or seek emergency care        Rubye Oaks, NP 11/09/2023

## 2024-05-09 LAB — LAB REPORT - SCANNED
A1c: 5
EGFR: 108.4

## 2024-06-21 ENCOUNTER — Encounter: Payer: Self-pay | Admitting: Obstetrics and Gynecology

## 2024-06-21 ENCOUNTER — Other Ambulatory Visit (HOSPITAL_COMMUNITY)
Admission: RE | Admit: 2024-06-21 | Discharge: 2024-06-21 | Disposition: A | Discharge status: Home / Self Care | Source: Ambulatory Visit | Attending: Obstetrics and Gynecology | Admitting: Obstetrics and Gynecology

## 2024-06-21 ENCOUNTER — Ambulatory Visit (INDEPENDENT_AMBULATORY_CARE_PROVIDER_SITE_OTHER): Admitting: Obstetrics and Gynecology

## 2024-06-21 VITALS — BP 100/60 | HR 94 | Ht 71.0 in | Wt 121.8 lb

## 2024-06-21 DIAGNOSIS — Z1151 Encounter for screening for human papillomavirus (HPV): Secondary | ICD-10-CM | POA: Diagnosis not present

## 2024-06-21 DIAGNOSIS — Z1211 Encounter for screening for malignant neoplasm of colon: Secondary | ICD-10-CM

## 2024-06-21 DIAGNOSIS — Z01419 Encounter for gynecological examination (general) (routine) without abnormal findings: Secondary | ICD-10-CM | POA: Diagnosis present

## 2024-06-21 DIAGNOSIS — F79 Unspecified intellectual disabilities: Secondary | ICD-10-CM

## 2024-06-21 DIAGNOSIS — M810 Age-related osteoporosis without current pathological fracture: Secondary | ICD-10-CM

## 2024-06-21 DIAGNOSIS — R269 Unspecified abnormalities of gait and mobility: Secondary | ICD-10-CM

## 2024-06-21 DIAGNOSIS — E2839 Other primary ovarian failure: Secondary | ICD-10-CM

## 2024-06-21 DIAGNOSIS — N952 Postmenopausal atrophic vaginitis: Secondary | ICD-10-CM

## 2024-06-21 MED ORDER — METAMUCIL SMOOTH TEXTURE 58.6 % PO POWD: 1.0000 | Freq: Every day | ORAL | 12 refills | Status: DC

## 2024-06-21 NOTE — Progress Notes (Addendum)
 53 y.o. y.o. female here for new medicare GYN annual exam. No LMP recorded. (Menstrual status: Perimenopausal).   Cerebral palsy with risk for falls. Has seizures History of right femur neck fracture with hip replacement due to fall and osteoporosis  Menopause for 6 years no PMB. No HRT use, low BMI 02/02/22 last pap smear with HPV testing negative Prior abnormal: denies MMG 02/20/14 Colonoscopy: unsure if done. Referral placed Dxa: osteoporosis 2019 -2.3 h/o multiple fractures.  Needs Evenity/prolia. To place PA Cannot swallow pill and reflux with CP.  Needs injection  Has care taker with her today    Body mass index is 19.08 kg/m.      No data to display          Pulse 94, weight 121 lb 12.8 oz (55.2 kg), SpO2 98%.  No results found for: DIAGPAP, HPVHIGH, ADEQPAP  GYN HISTORY: No results found for: DIAGPAP, HPVHIGH, ADEQPAP  OB History  No obstetric history on file.    Past Medical History:  Diagnosis Date   Anxiety    Cerebral palsy (HCC)    Depression    Hip fracture (HCC)    Intellectual disability    Osteoporosis    Schizophrenia (HCC)    Seizures (HCC)     No past surgical history on file.  Current Outpatient Medications on File Prior to Visit  Medication Sig Dispense Refill   hydrOXYzine (VISTARIL) 50 MG capsule Take 50 mg by mouth 3 (three) times daily.      Calcium Carb-Cholecalciferol (CALCIUM+D3) 600-800 MG-UNIT TABS Take 1 tablet by mouth daily.     cholecalciferol (VITAMIN D3) 25 MCG (1000 UT) tablet Take 1,000 Units by mouth daily.     citalopram (CELEXA) 20 MG tablet Take by mouth.     diazePAM (VALTOCO 15 MG DOSE NA) Place 1 spray into both nostrils as needed (seizure greater than or equal to 3 mins). (Patient not taking: Reported on 01/26/2023)     DIAZEPAM PO Place into the nose. (Patient not taking: Reported on 11/09/2023)     divalproex  (DEPAKOTE ) 250 MG DR tablet Take 625 mg twice daily 180 tablet 12   Docusate Sodium  150 MG/15ML syrup Take by mouth.     hydrOXYzine (ATARAX/VISTARIL) 50 MG tablet Take by mouth.     melatonin 3 MG TABS tablet Take by mouth. (Patient not taking: Reported on 11/09/2023)     mirabegron ER (MYRBETRIQ) 50 MG TB24 tablet Take 50 mg by mouth daily. (Patient not taking: Reported on 01/26/2023)     OLANZapine (ZYPREXA) 2.5 MG tablet every night.     Olopatadine HCl 0.2 % SOLN Place 1 drop into both eyes every morning.      Olopatadine HCl 0.2 % SOLN 1 drop 1 (one) time each day.     ondansetron  (ZOFRAN  ODT) 4 MG disintegrating tablet Take 1 tablet (4 mg total) by mouth every 8 (eight) hours as needed for nausea or vomiting. 20 tablet 0   pantoprazole (PROTONIX) 20 MG tablet Take 20 mg by mouth daily.     pantoprazole (PROTONIX) 20 MG tablet Take by mouth.     polyethylene glycol powder (GLYCOLAX/MIRALAX) 17 GM/SCOOP powder Take by mouth.     promethazine (PHENERGAN) 25 MG suppository Place 25 mg rectally every 4 (four) hours as needed for vomiting.      promethazine (PHENERGAN) 25 MG suppository Place rectally.     promethazine (PHENERGAN) 25 MG tablet Take 25 mg by mouth every 4 (four) hours  as needed for vomiting.  (Patient not taking: Reported on 01/26/2023)     promethazine (PHENERGAN) 25 MG tablet Take by mouth.     senna-docusate (SENOKOT-S) 8.6-50 MG tablet Take 1 tablet by mouth daily.     sodium fluoride (PREVIDENT 5000 PLUS) 1.1 % CREA dental cream Place 1 application onto teeth every evening.     trospium (SANCTURA) 20 MG tablet Take 20 mg by mouth 2 (two) times daily.     VIMPAT  200 MG TABS tablet Take 1 tablet (200 mg total) by mouth 2 (two) times daily. 60 tablet 5   No current facility-administered medications on file prior to visit.    Social History   Socioeconomic History   Marital status: Single    Spouse name: Not on file   Number of children: 0   Years of education: Not on file   Highest education level: Not on file  Occupational History   Not on file   Tobacco Use   Smoking status: Never   Smokeless tobacco: Never  Vaping Use   Vaping status: Never Used  Substance and Sexual Activity   Alcohol use: Never   Drug use: Never   Sexual activity: Not Currently  Other Topics Concern   Not on file  Social History Narrative   04/09/20 lives at [facility] group home   Social Drivers of Health   Financial Resource Strain: Not on file  Food Insecurity: Not on file  Transportation Needs: Not on file  Physical Activity: Not on file  Stress: Not on file  Social Connections: Unknown (10/27/2022)   Received from [facility]   Social Network    Social Network: Not on file  Intimate Partner Violence: Unknown (10/27/2022)   Received from [facility]   HITS    Physically Hurt: Not on file    Insult or Talk Down To: Not on file    Threaten Physical Harm: Not on file    Scream or Curse: Not on file    No family history on file.   Allergies  Allergen Reactions   Penicillin G     Other reaction(s): Other unknown   Penicillins     On Mar- unsure of other information      Patient's last menstrual period was No LMP recorded. (Menstrual status: Perimenopausal)..            Review of Systems Alls systems reviewed and are negative.     Physical Exam Constitutional:      Appearance: Normal appearance.  Genitourinary:     Vulva and urethral meatus normal.     No lesions in the vagina.     Right Labia: No rash, lesions or skin changes.    Left Labia: No lesions, skin changes or rash.    No vaginal discharge or tenderness.     No vaginal prolapse present.    Moderate vaginal atrophy present.     Right Adnexa: not tender, not palpable and no mass present.    Left Adnexa: not tender, not palpable and no mass present.    No cervical motion tenderness or discharge.     Uterus is not enlarged, tender or irregular.  Breasts:    Right: Normal.     Left: Normal.  HENT:     Head: Normocephalic.  Neck:     Thyroid: No thyroid  mass, thyromegaly or thyroid tenderness.  Cardiovascular:     Rate and Rhythm: Normal rate and regular rhythm.     Heart sounds:  Normal heart sounds, S1 normal and S2 normal.  Pulmonary:     Effort: Pulmonary effort is normal.     Breath sounds: Normal breath sounds and air entry.  Abdominal:     General: There is no distension.     Palpations: Abdomen is soft. There is no mass.     Tenderness: There is no abdominal tenderness. There is no guarding or rebound.  Musculoskeletal:        General: Normal range of motion.     Cervical back: Full passive range of motion without pain, normal range of motion and neck supple. No tenderness.     Right lower leg: No edema.     Left lower leg: No edema.  Neurological:     Mental Status: She is alert.  Skin:    General: Skin is warm.  Psychiatric:        Mood and Affect: Mood normal.        Behavior: Behavior normal.        Thought Content: Thought content normal.  Vitals and nursing note reviewed. Exam conducted with a chaperone present.    Geni, CMA was present for the entire exam   A:         [facility] medicare GYN exam                             P:        Pap smear collected today Encouraged annual mammogram screening Colon cancer screening referral placed today DXA ordered today osteoporosis with falls and fractures. Needs evenity and or prolia for bone support. Information provided and counseled on the r/b/a/I. PA placed. Labs and immunizations to do with PMD Encouraged healthy lifestyle practices Encouraged Vit D and Calcium  Metamucil daily for constipation  No follow-ups on file.  Almarie MARLA Carpen

## 2024-06-22 ENCOUNTER — Telehealth: Payer: Self-pay | Admitting: *Deleted

## 2024-06-22 NOTE — Telephone Encounter (Signed)
 Camelia Delude, nurse from [facility] calling to confirm plan of care from OV 06/21/24. Asking where patient receives Prolia, Evenity and BMD? Was advised Eleanor is the guardian at the Group Home. Contact for [facility] is [phone].  Advised benefits will be reviewed for Prolia/Evenity, patient to be notified once complete.   Call to schedule BMD at [phone].   Routing FYI.   Encounter closed.

## 2024-06-23 ENCOUNTER — Ambulatory Visit: Payer: Self-pay | Admitting: Obstetrics and Gynecology

## 2024-06-23 LAB — CYTOLOGY - PAP
Adequacy: ABSENT
Comment: NEGATIVE
Diagnosis: NEGATIVE
High risk HPV: NEGATIVE

## 2024-06-26 NOTE — Telephone Encounter (Signed)
 I have LM for Crystal to call back to let me know if she would like me to fax the rx for Metamucil or mail it.

## 2024-06-26 NOTE — Telephone Encounter (Signed)
 Voicemail received on Triage line, fax Rx to [phone].   If any additional questions calls [phone]

## 2024-06-28 NOTE — Telephone Encounter (Signed)
 Geni -can you f/u with Crystal in the morning regarding this Rx.

## 2024-06-29 NOTE — Telephone Encounter (Signed)
 Spoke with Crystal verified the Fax # and made her aware that I would fax over the RX this morning.   I faxed the rx and got a successful conformation.

## 2024-07-19 ENCOUNTER — Other Ambulatory Visit: Payer: Self-pay | Admitting: *Deleted

## 2024-07-19 DIAGNOSIS — M810 Age-related osteoporosis without current pathological fracture: Secondary | ICD-10-CM

## 2024-07-19 MED ORDER — DENOSUMAB 60 MG/ML ~~LOC~~ SOSY
60.0000 mg | PREFILLED_SYRINGE | Freq: Once | SUBCUTANEOUS | Status: AC
Start: 2024-08-02 — End: ?

## 2024-08-30 ENCOUNTER — Ambulatory Visit (INDEPENDENT_AMBULATORY_CARE_PROVIDER_SITE_OTHER)

## 2024-08-30 DIAGNOSIS — F79 Unspecified intellectual disabilities: Secondary | ICD-10-CM

## 2024-08-30 DIAGNOSIS — Z01419 Encounter for gynecological examination (general) (routine) without abnormal findings: Secondary | ICD-10-CM

## 2024-08-30 DIAGNOSIS — R269 Unspecified abnormalities of gait and mobility: Secondary | ICD-10-CM

## 2024-08-30 DIAGNOSIS — Z13820 Encounter for screening for osteoporosis: Secondary | ICD-10-CM | POA: Diagnosis not present

## 2024-08-30 DIAGNOSIS — E2839 Other primary ovarian failure: Secondary | ICD-10-CM

## 2024-09-04 ENCOUNTER — Other Ambulatory Visit: Payer: Self-pay | Admitting: *Deleted

## 2024-09-04 ENCOUNTER — Telehealth: Payer: Self-pay

## 2024-09-04 MED ORDER — ROMOSOZUMAB-AQQG 105 MG/1.17ML ~~LOC~~ SOSY
210.0000 mg | PREFILLED_SYRINGE | Freq: Once | SUBCUTANEOUS | Status: AC
  Administered 2024-09-29: 210 mg via SUBCUTANEOUS

## 2024-09-04 NOTE — Telephone Encounter (Signed)
 Evenity  VOB initiated via [email or web address]  Last OV:  Next OV:  Last Evenity  inj:  Next Evenity  inj DUE: [facility]

## 2024-09-11 NOTE — Telephone Encounter (Signed)
 Katherine Wolf

## 2024-09-11 NOTE — Telephone Encounter (Signed)
 Buy/Bill (Office supplied medication)  Out-of-pocket cost due at time of clinic visit: $0  Number of injection/visits approved: ---  Primary: MEDICARE Co-insurance: 0% Admin fee co-insurance: 0%  Secondary: MEDICAID Co-insurance:  Admin fee co-insurance:   Medical Benefit Details: Date Benefits were checked: 09/11/24 Deductible: NO/ Coinsurance: 0%/ Admin Fee: 0%  Prior Auth: N/A PA# Expiration Date:   # of doses approved: -----------------------------------------------------------------------  Patient NOT eligible for Copay Card. Copay Card can make patient's cost as little as $25. Link to apply: [email or web address]://[email or web address]  ** This summary of benefits is an estimation of the patient's out-of-pocket cost. Exact cost may very based on individual plan coverage.

## 2024-09-29 ENCOUNTER — Ambulatory Visit

## 2024-09-29 DIAGNOSIS — M810 Age-related osteoporosis without current pathological fracture: Secondary | ICD-10-CM

## 2024-10-18 ENCOUNTER — Other Ambulatory Visit: Payer: Self-pay | Admitting: *Deleted

## 2024-10-18 DIAGNOSIS — M810 Age-related osteoporosis without current pathological fracture: Secondary | ICD-10-CM

## 2024-10-18 MED ORDER — ROMOSOZUMAB-AQQG 105 MG/1.17ML ~~LOC~~ SOSY
210.0000 mg | PREFILLED_SYRINGE | SUBCUTANEOUS | Status: AC
  Administered 2024-11-15: 210 mg via SUBCUTANEOUS

## 2024-10-20 ENCOUNTER — Telehealth: Payer: Self-pay

## 2024-10-20 NOTE — Telephone Encounter (Signed)
 Evenity  VOB initiated via [email or web address]  Last OV:  Next OV:  Last Evenity  inj: 09/29/24 Next Evenity  inj DUE: 10/30/24

## 2024-10-27 NOTE — Telephone Encounter (Signed)
 SABRA

## 2024-11-02 ENCOUNTER — Ambulatory Visit

## 2024-11-02 ENCOUNTER — Other Ambulatory Visit (HOSPITAL_COMMUNITY): Payer: Self-pay

## 2024-11-02 NOTE — Telephone Encounter (Signed)
 Buy/Bill (Office supplied medication)  Out-of-pocket cost due at time of clinic visit: $4  Number of injection/visits approved: ---  Primary: MEDICARE Co-insurance: 0% Admin fee co-insurance: 0%  Secondary: [place] MEDICAID Co-insurance:  Admin fee co-insurance:   Medical Benefit Details: Date Benefits were checked: 11/02/24 Deductible: NO/ Coinsurance: 0%/ Admin Fee: 0%  Prior Auth: N/A PA# Expiration Date:   # of doses approved: -----------------------------------------------------------------------  Patient NOT eligible for Copay Card. Copay Card can make patient's cost as little as $25. Link to apply: [email or web address]://[email or web address]  ** This summary of benefits is an estimation of the patient's out-of-pocket cost. Exact cost may very based on individual plan coverage.

## 2024-11-02 NOTE — Telephone Encounter (Signed)
" ° °  Called Medicaid to check benefits. Per representative, coverage is active, and patient is responsible for $4 copay.   REF# [id number] "

## 2024-11-03 NOTE — Telephone Encounter (Signed)
 See referral

## 2024-11-15 ENCOUNTER — Ambulatory Visit (INDEPENDENT_AMBULATORY_CARE_PROVIDER_SITE_OTHER)

## 2024-11-15 DIAGNOSIS — M810 Age-related osteoporosis without current pathological fracture: Secondary | ICD-10-CM | POA: Diagnosis not present

## 2024-11-15 NOTE — Progress Notes (Signed)
 Pt here for her 2nd Evenity  injection. Pt got injection in her right and left SQ arm. She tolerated the injection fine.

## 2024-12-14 ENCOUNTER — Ambulatory Visit

## 2024-12-19 ENCOUNTER — Ambulatory Visit: Admitting: Adult Health

## 2025-06-27 ENCOUNTER — Ambulatory Visit: Admitting: Obstetrics and Gynecology
